# Patient Record
Sex: Female | Born: 1935
Health system: Southern US, Community
[De-identification: ages and names within clinical notes are randomized; demographics above are authoritative.]

## PROBLEM LIST (undated history)

## (undated) DIAGNOSIS — I10 Essential (primary) hypertension: Secondary | ICD-10-CM

## (undated) DIAGNOSIS — L570 Actinic keratosis: Secondary | ICD-10-CM

## (undated) DIAGNOSIS — E039 Hypothyroidism, unspecified: Secondary | ICD-10-CM

## (undated) DIAGNOSIS — L821 Other seborrheic keratosis: Secondary | ICD-10-CM

## (undated) DIAGNOSIS — E2839 Other primary ovarian failure: Secondary | ICD-10-CM

## (undated) DIAGNOSIS — I4729 Other ventricular tachycardia: Secondary | ICD-10-CM

## (undated) DIAGNOSIS — R0600 Dyspnea, unspecified: Secondary | ICD-10-CM

## (undated) DIAGNOSIS — M1990 Unspecified osteoarthritis, unspecified site: Secondary | ICD-10-CM

## (undated) DIAGNOSIS — N289 Disorder of kidney and ureter, unspecified: Secondary | ICD-10-CM

## (undated) DIAGNOSIS — R000 Tachycardia, unspecified: Secondary | ICD-10-CM

## (undated) DIAGNOSIS — H9190 Unspecified hearing loss, unspecified ear: Secondary | ICD-10-CM

## (undated) DIAGNOSIS — D7589 Other specified diseases of blood and blood-forming organs: Secondary | ICD-10-CM

## (undated) DIAGNOSIS — M19019 Primary osteoarthritis, unspecified shoulder: Secondary | ICD-10-CM

## (undated) DIAGNOSIS — G8929 Other chronic pain: Secondary | ICD-10-CM

## (undated) DIAGNOSIS — I272 Pulmonary hypertension, unspecified: Secondary | ICD-10-CM

## (undated) DIAGNOSIS — I2510 Atherosclerotic heart disease of native coronary artery without angina pectoris: Secondary | ICD-10-CM

## (undated) DIAGNOSIS — T148XXA Other injury of unspecified body region, initial encounter: Secondary | ICD-10-CM

## (undated) DIAGNOSIS — I472 Ventricular tachycardia: Secondary | ICD-10-CM

## (undated) DIAGNOSIS — E559 Vitamin D deficiency, unspecified: Secondary | ICD-10-CM

## (undated) DIAGNOSIS — E7800 Pure hypercholesterolemia, unspecified: Secondary | ICD-10-CM

## (undated) DIAGNOSIS — I351 Nonrheumatic aortic (valve) insufficiency: Secondary | ICD-10-CM

## (undated) DIAGNOSIS — I219 Acute myocardial infarction, unspecified: Secondary | ICD-10-CM

## (undated) DIAGNOSIS — I4949 Other premature depolarization: Secondary | ICD-10-CM

## (undated) DIAGNOSIS — I340 Nonrheumatic mitral (valve) insufficiency: Secondary | ICD-10-CM

## (undated) DIAGNOSIS — M549 Dorsalgia, unspecified: Secondary | ICD-10-CM

## (undated) DIAGNOSIS — I447 Left bundle-branch block, unspecified: Secondary | ICD-10-CM

## (undated) DIAGNOSIS — I5022 Chronic systolic (congestive) heart failure: Secondary | ICD-10-CM

## (undated) DIAGNOSIS — M109 Gout, unspecified: Secondary | ICD-10-CM

## (undated) HISTORY — DX: Essential (primary) hypertension: I10

## (undated) HISTORY — DX: Tachycardia, unspecified: R00.0

## (undated) HISTORY — DX: Unspecified osteoarthritis, unspecified site: M19.90

## (undated) HISTORY — DX: Other seborrheic keratosis: L82.1

## (undated) HISTORY — DX: Other primary ovarian failure: E28.39

## (undated) HISTORY — PX: CATARACT EXTRACTION, BILATERAL: SHX1313

## (undated) HISTORY — DX: Vitamin D deficiency, unspecified: E55.9

## (undated) HISTORY — DX: Gout, unspecified: M10.9

## (undated) HISTORY — DX: Other premature depolarization: I49.49

## (undated) HISTORY — DX: Pure hypercholesterolemia, unspecified: E78.00

## (undated) HISTORY — DX: Actinic keratosis: L57.0

## (undated) HISTORY — PX: APPENDECTOMY: SHX54

## (undated) HISTORY — DX: Disorder of kidney and ureter, unspecified: N28.9

## (undated) HISTORY — DX: Unspecified hearing loss, unspecified ear: H91.90

## (undated) HISTORY — DX: Primary osteoarthritis, unspecified shoulder: M19.019

## (undated) HISTORY — DX: Hypercalcemia: E83.52

## (undated) HISTORY — PX: ABDOMINAL HYSTERECTOMY: SHX81

## (undated) HISTORY — DX: Left bundle-branch block, unspecified: I44.7

## (undated) HISTORY — PX: COLONOSCOPY: SHX174

---

## 2015-01-24 DIAGNOSIS — M1712 Unilateral primary osteoarthritis, left knee: Secondary | ICD-10-CM | POA: Diagnosis not present

## 2015-01-24 DIAGNOSIS — M1711 Unilateral primary osteoarthritis, right knee: Secondary | ICD-10-CM | POA: Diagnosis not present

## 2015-01-24 DIAGNOSIS — M170 Bilateral primary osteoarthritis of knee: Secondary | ICD-10-CM | POA: Diagnosis not present

## 2015-04-16 DIAGNOSIS — Z6830 Body mass index (BMI) 30.0-30.9, adult: Secondary | ICD-10-CM | POA: Diagnosis not present

## 2015-04-16 DIAGNOSIS — E785 Hyperlipidemia, unspecified: Secondary | ICD-10-CM | POA: Diagnosis not present

## 2015-04-16 DIAGNOSIS — Z789 Other specified health status: Secondary | ICD-10-CM | POA: Diagnosis not present

## 2015-04-16 DIAGNOSIS — I10 Essential (primary) hypertension: Secondary | ICD-10-CM | POA: Diagnosis not present

## 2015-05-28 DIAGNOSIS — E7800 Pure hypercholesterolemia, unspecified: Secondary | ICD-10-CM | POA: Diagnosis not present

## 2015-05-28 DIAGNOSIS — I10 Essential (primary) hypertension: Secondary | ICD-10-CM | POA: Diagnosis not present

## 2015-05-28 DIAGNOSIS — M159 Polyosteoarthritis, unspecified: Secondary | ICD-10-CM | POA: Diagnosis not present

## 2015-07-02 DIAGNOSIS — M159 Polyosteoarthritis, unspecified: Secondary | ICD-10-CM | POA: Diagnosis not present

## 2015-07-02 DIAGNOSIS — E7800 Pure hypercholesterolemia, unspecified: Secondary | ICD-10-CM | POA: Diagnosis not present

## 2015-07-02 DIAGNOSIS — I10 Essential (primary) hypertension: Secondary | ICD-10-CM | POA: Diagnosis not present

## 2015-07-22 DIAGNOSIS — M1711 Unilateral primary osteoarthritis, right knee: Secondary | ICD-10-CM | POA: Diagnosis not present

## 2015-07-22 DIAGNOSIS — M1712 Unilateral primary osteoarthritis, left knee: Secondary | ICD-10-CM | POA: Diagnosis not present

## 2015-07-22 DIAGNOSIS — M170 Bilateral primary osteoarthritis of knee: Secondary | ICD-10-CM | POA: Diagnosis not present

## 2015-07-26 DIAGNOSIS — E7800 Pure hypercholesterolemia, unspecified: Secondary | ICD-10-CM | POA: Diagnosis not present

## 2015-07-26 DIAGNOSIS — I10 Essential (primary) hypertension: Secondary | ICD-10-CM | POA: Diagnosis not present

## 2015-07-26 DIAGNOSIS — M159 Polyosteoarthritis, unspecified: Secondary | ICD-10-CM | POA: Diagnosis not present

## 2015-08-27 DIAGNOSIS — E7800 Pure hypercholesterolemia, unspecified: Secondary | ICD-10-CM | POA: Diagnosis not present

## 2015-08-27 DIAGNOSIS — M159 Polyosteoarthritis, unspecified: Secondary | ICD-10-CM | POA: Diagnosis not present

## 2015-08-27 DIAGNOSIS — I10 Essential (primary) hypertension: Secondary | ICD-10-CM | POA: Diagnosis not present

## 2015-09-13 DIAGNOSIS — I10 Essential (primary) hypertension: Secondary | ICD-10-CM | POA: Diagnosis not present

## 2015-09-13 DIAGNOSIS — M159 Polyosteoarthritis, unspecified: Secondary | ICD-10-CM | POA: Diagnosis not present

## 2015-09-13 DIAGNOSIS — E7800 Pure hypercholesterolemia, unspecified: Secondary | ICD-10-CM | POA: Diagnosis not present

## 2015-10-23 DIAGNOSIS — Z6830 Body mass index (BMI) 30.0-30.9, adult: Secondary | ICD-10-CM | POA: Diagnosis not present

## 2015-10-23 DIAGNOSIS — Z299 Encounter for prophylactic measures, unspecified: Secondary | ICD-10-CM | POA: Diagnosis not present

## 2015-10-23 DIAGNOSIS — E785 Hyperlipidemia, unspecified: Secondary | ICD-10-CM | POA: Diagnosis not present

## 2015-10-23 DIAGNOSIS — Z0000 Encounter for general adult medical examination without abnormal findings: Secondary | ICD-10-CM | POA: Diagnosis not present

## 2015-10-23 DIAGNOSIS — Z1211 Encounter for screening for malignant neoplasm of colon: Secondary | ICD-10-CM | POA: Diagnosis not present

## 2015-10-23 DIAGNOSIS — Z1389 Encounter for screening for other disorder: Secondary | ICD-10-CM | POA: Diagnosis not present

## 2015-10-23 DIAGNOSIS — Z79899 Other long term (current) drug therapy: Secondary | ICD-10-CM | POA: Diagnosis not present

## 2015-10-23 DIAGNOSIS — R5383 Other fatigue: Secondary | ICD-10-CM | POA: Diagnosis not present

## 2015-10-23 DIAGNOSIS — Z7189 Other specified counseling: Secondary | ICD-10-CM | POA: Diagnosis not present

## 2015-10-23 DIAGNOSIS — E559 Vitamin D deficiency, unspecified: Secondary | ICD-10-CM | POA: Diagnosis not present

## 2015-10-24 DIAGNOSIS — M1712 Unilateral primary osteoarthritis, left knee: Secondary | ICD-10-CM | POA: Diagnosis not present

## 2015-10-24 DIAGNOSIS — M25561 Pain in right knee: Secondary | ICD-10-CM | POA: Diagnosis not present

## 2015-10-24 DIAGNOSIS — M1711 Unilateral primary osteoarthritis, right knee: Secondary | ICD-10-CM | POA: Diagnosis not present

## 2015-11-04 DIAGNOSIS — I10 Essential (primary) hypertension: Secondary | ICD-10-CM | POA: Diagnosis not present

## 2015-11-04 DIAGNOSIS — M159 Polyosteoarthritis, unspecified: Secondary | ICD-10-CM | POA: Diagnosis not present

## 2015-11-04 DIAGNOSIS — E7800 Pure hypercholesterolemia, unspecified: Secondary | ICD-10-CM | POA: Diagnosis not present

## 2015-12-02 DIAGNOSIS — M159 Polyosteoarthritis, unspecified: Secondary | ICD-10-CM | POA: Diagnosis not present

## 2015-12-02 DIAGNOSIS — I10 Essential (primary) hypertension: Secondary | ICD-10-CM | POA: Diagnosis not present

## 2015-12-02 DIAGNOSIS — E7800 Pure hypercholesterolemia, unspecified: Secondary | ICD-10-CM | POA: Diagnosis not present

## 2015-12-17 DIAGNOSIS — E2839 Other primary ovarian failure: Secondary | ICD-10-CM | POA: Diagnosis not present

## 2015-12-24 DIAGNOSIS — E7800 Pure hypercholesterolemia, unspecified: Secondary | ICD-10-CM | POA: Diagnosis not present

## 2015-12-24 DIAGNOSIS — I10 Essential (primary) hypertension: Secondary | ICD-10-CM | POA: Diagnosis not present

## 2015-12-24 DIAGNOSIS — M159 Polyosteoarthritis, unspecified: Secondary | ICD-10-CM | POA: Diagnosis not present

## 2016-01-11 DIAGNOSIS — Z23 Encounter for immunization: Secondary | ICD-10-CM | POA: Diagnosis not present

## 2016-01-21 DIAGNOSIS — E7800 Pure hypercholesterolemia, unspecified: Secondary | ICD-10-CM | POA: Diagnosis not present

## 2016-01-21 DIAGNOSIS — I10 Essential (primary) hypertension: Secondary | ICD-10-CM | POA: Diagnosis not present

## 2016-01-21 DIAGNOSIS — M159 Polyosteoarthritis, unspecified: Secondary | ICD-10-CM | POA: Diagnosis not present

## 2016-02-26 DIAGNOSIS — I10 Essential (primary) hypertension: Secondary | ICD-10-CM | POA: Diagnosis not present

## 2016-02-26 DIAGNOSIS — E7800 Pure hypercholesterolemia, unspecified: Secondary | ICD-10-CM | POA: Diagnosis not present

## 2016-02-26 DIAGNOSIS — M159 Polyosteoarthritis, unspecified: Secondary | ICD-10-CM | POA: Diagnosis not present

## 2016-03-23 DIAGNOSIS — E7800 Pure hypercholesterolemia, unspecified: Secondary | ICD-10-CM | POA: Diagnosis not present

## 2016-03-23 DIAGNOSIS — I10 Essential (primary) hypertension: Secondary | ICD-10-CM | POA: Diagnosis not present

## 2016-03-23 DIAGNOSIS — M159 Polyosteoarthritis, unspecified: Secondary | ICD-10-CM | POA: Diagnosis not present

## 2016-04-09 DIAGNOSIS — M545 Low back pain: Secondary | ICD-10-CM | POA: Diagnosis not present

## 2016-04-09 DIAGNOSIS — Z299 Encounter for prophylactic measures, unspecified: Secondary | ICD-10-CM | POA: Diagnosis not present

## 2016-04-09 DIAGNOSIS — Z789 Other specified health status: Secondary | ICD-10-CM | POA: Diagnosis not present

## 2016-04-09 DIAGNOSIS — Z713 Dietary counseling and surveillance: Secondary | ICD-10-CM | POA: Diagnosis not present

## 2016-04-09 DIAGNOSIS — Z6831 Body mass index (BMI) 31.0-31.9, adult: Secondary | ICD-10-CM | POA: Diagnosis not present

## 2016-04-09 DIAGNOSIS — I10 Essential (primary) hypertension: Secondary | ICD-10-CM | POA: Diagnosis not present

## 2016-04-20 DIAGNOSIS — E7800 Pure hypercholesterolemia, unspecified: Secondary | ICD-10-CM | POA: Diagnosis not present

## 2016-04-20 DIAGNOSIS — M159 Polyosteoarthritis, unspecified: Secondary | ICD-10-CM | POA: Diagnosis not present

## 2016-04-20 DIAGNOSIS — I10 Essential (primary) hypertension: Secondary | ICD-10-CM | POA: Diagnosis not present

## 2016-05-07 DIAGNOSIS — I10 Essential (primary) hypertension: Secondary | ICD-10-CM | POA: Diagnosis not present

## 2016-05-07 DIAGNOSIS — E7800 Pure hypercholesterolemia, unspecified: Secondary | ICD-10-CM | POA: Diagnosis not present

## 2016-05-07 DIAGNOSIS — M159 Polyosteoarthritis, unspecified: Secondary | ICD-10-CM | POA: Diagnosis not present

## 2016-05-19 DIAGNOSIS — H10503 Unspecified blepharoconjunctivitis, bilateral: Secondary | ICD-10-CM | POA: Diagnosis not present

## 2016-05-22 DIAGNOSIS — R1013 Epigastric pain: Secondary | ICD-10-CM | POA: Diagnosis not present

## 2016-05-22 DIAGNOSIS — R1032 Left lower quadrant pain: Secondary | ICD-10-CM | POA: Diagnosis not present

## 2016-05-22 DIAGNOSIS — R1110 Vomiting, unspecified: Secondary | ICD-10-CM | POA: Diagnosis not present

## 2016-05-22 DIAGNOSIS — K529 Noninfective gastroenteritis and colitis, unspecified: Secondary | ICD-10-CM | POA: Diagnosis not present

## 2016-05-22 DIAGNOSIS — M1990 Unspecified osteoarthritis, unspecified site: Secondary | ICD-10-CM | POA: Diagnosis not present

## 2016-05-22 DIAGNOSIS — R109 Unspecified abdominal pain: Secondary | ICD-10-CM | POA: Diagnosis not present

## 2016-05-22 DIAGNOSIS — I10 Essential (primary) hypertension: Secondary | ICD-10-CM | POA: Diagnosis not present

## 2016-06-02 DIAGNOSIS — Z6830 Body mass index (BMI) 30.0-30.9, adult: Secondary | ICD-10-CM | POA: Diagnosis not present

## 2016-06-02 DIAGNOSIS — K529 Noninfective gastroenteritis and colitis, unspecified: Secondary | ICD-10-CM | POA: Diagnosis not present

## 2016-06-02 DIAGNOSIS — Z299 Encounter for prophylactic measures, unspecified: Secondary | ICD-10-CM | POA: Diagnosis not present

## 2016-06-02 DIAGNOSIS — E7800 Pure hypercholesterolemia, unspecified: Secondary | ICD-10-CM | POA: Diagnosis not present

## 2016-06-02 DIAGNOSIS — Z713 Dietary counseling and surveillance: Secondary | ICD-10-CM | POA: Diagnosis not present

## 2016-06-02 DIAGNOSIS — I6529 Occlusion and stenosis of unspecified carotid artery: Secondary | ICD-10-CM | POA: Diagnosis not present

## 2016-06-02 DIAGNOSIS — I10 Essential (primary) hypertension: Secondary | ICD-10-CM | POA: Diagnosis not present

## 2016-06-02 DIAGNOSIS — E785 Hyperlipidemia, unspecified: Secondary | ICD-10-CM | POA: Diagnosis not present

## 2016-06-02 DIAGNOSIS — K219 Gastro-esophageal reflux disease without esophagitis: Secondary | ICD-10-CM | POA: Diagnosis not present

## 2016-06-08 DIAGNOSIS — I10 Essential (primary) hypertension: Secondary | ICD-10-CM | POA: Diagnosis not present

## 2016-06-08 DIAGNOSIS — Z6830 Body mass index (BMI) 30.0-30.9, adult: Secondary | ICD-10-CM | POA: Diagnosis not present

## 2016-06-08 DIAGNOSIS — Z713 Dietary counseling and surveillance: Secondary | ICD-10-CM | POA: Diagnosis not present

## 2016-06-08 DIAGNOSIS — Z299 Encounter for prophylactic measures, unspecified: Secondary | ICD-10-CM | POA: Diagnosis not present

## 2016-06-08 DIAGNOSIS — K5900 Constipation, unspecified: Secondary | ICD-10-CM | POA: Diagnosis not present

## 2016-07-02 DIAGNOSIS — H524 Presbyopia: Secondary | ICD-10-CM | POA: Diagnosis not present

## 2016-07-02 DIAGNOSIS — H43813 Vitreous degeneration, bilateral: Secondary | ICD-10-CM | POA: Diagnosis not present

## 2016-07-02 DIAGNOSIS — H52221 Regular astigmatism, right eye: Secondary | ICD-10-CM | POA: Diagnosis not present

## 2016-07-02 DIAGNOSIS — H5212 Myopia, left eye: Secondary | ICD-10-CM | POA: Diagnosis not present

## 2016-07-28 DIAGNOSIS — I10 Essential (primary) hypertension: Secondary | ICD-10-CM | POA: Diagnosis not present

## 2016-07-28 DIAGNOSIS — E7800 Pure hypercholesterolemia, unspecified: Secondary | ICD-10-CM | POA: Diagnosis not present

## 2016-07-28 DIAGNOSIS — Z6830 Body mass index (BMI) 30.0-30.9, adult: Secondary | ICD-10-CM | POA: Diagnosis not present

## 2016-07-28 DIAGNOSIS — M545 Low back pain: Secondary | ICD-10-CM | POA: Diagnosis not present

## 2016-07-28 DIAGNOSIS — Z299 Encounter for prophylactic measures, unspecified: Secondary | ICD-10-CM | POA: Diagnosis not present

## 2016-07-29 DIAGNOSIS — M159 Polyosteoarthritis, unspecified: Secondary | ICD-10-CM | POA: Diagnosis not present

## 2016-07-29 DIAGNOSIS — E7800 Pure hypercholesterolemia, unspecified: Secondary | ICD-10-CM | POA: Diagnosis not present

## 2016-07-29 DIAGNOSIS — I10 Essential (primary) hypertension: Secondary | ICD-10-CM | POA: Diagnosis not present

## 2016-09-16 DIAGNOSIS — M159 Polyosteoarthritis, unspecified: Secondary | ICD-10-CM | POA: Diagnosis not present

## 2016-09-16 DIAGNOSIS — I10 Essential (primary) hypertension: Secondary | ICD-10-CM | POA: Diagnosis not present

## 2016-09-16 DIAGNOSIS — E7800 Pure hypercholesterolemia, unspecified: Secondary | ICD-10-CM | POA: Diagnosis not present

## 2016-10-12 DIAGNOSIS — M545 Low back pain: Secondary | ICD-10-CM | POA: Diagnosis not present

## 2016-10-12 DIAGNOSIS — M19019 Primary osteoarthritis, unspecified shoulder: Secondary | ICD-10-CM | POA: Diagnosis not present

## 2016-10-12 DIAGNOSIS — I447 Left bundle-branch block, unspecified: Secondary | ICD-10-CM | POA: Diagnosis not present

## 2016-10-12 DIAGNOSIS — Z6829 Body mass index (BMI) 29.0-29.9, adult: Secondary | ICD-10-CM | POA: Diagnosis not present

## 2016-10-12 DIAGNOSIS — Z299 Encounter for prophylactic measures, unspecified: Secondary | ICD-10-CM | POA: Diagnosis not present

## 2016-10-12 DIAGNOSIS — Z713 Dietary counseling and surveillance: Secondary | ICD-10-CM | POA: Diagnosis not present

## 2016-10-13 DIAGNOSIS — Z23 Encounter for immunization: Secondary | ICD-10-CM | POA: Diagnosis not present

## 2016-10-23 DIAGNOSIS — Z1339 Encounter for screening examination for other mental health and behavioral disorders: Secondary | ICD-10-CM | POA: Diagnosis not present

## 2016-10-23 DIAGNOSIS — Z79899 Other long term (current) drug therapy: Secondary | ICD-10-CM | POA: Diagnosis not present

## 2016-10-23 DIAGNOSIS — E559 Vitamin D deficiency, unspecified: Secondary | ICD-10-CM | POA: Diagnosis not present

## 2016-10-23 DIAGNOSIS — Z299 Encounter for prophylactic measures, unspecified: Secondary | ICD-10-CM | POA: Diagnosis not present

## 2016-10-23 DIAGNOSIS — Z6829 Body mass index (BMI) 29.0-29.9, adult: Secondary | ICD-10-CM | POA: Diagnosis not present

## 2016-10-23 DIAGNOSIS — M545 Low back pain: Secondary | ICD-10-CM | POA: Diagnosis not present

## 2016-10-23 DIAGNOSIS — Z1211 Encounter for screening for malignant neoplasm of colon: Secondary | ICD-10-CM | POA: Diagnosis not present

## 2016-10-23 DIAGNOSIS — R5383 Other fatigue: Secondary | ICD-10-CM | POA: Diagnosis not present

## 2016-10-23 DIAGNOSIS — Z7189 Other specified counseling: Secondary | ICD-10-CM | POA: Diagnosis not present

## 2016-10-23 DIAGNOSIS — E7800 Pure hypercholesterolemia, unspecified: Secondary | ICD-10-CM | POA: Diagnosis not present

## 2016-10-23 DIAGNOSIS — Z1331 Encounter for screening for depression: Secondary | ICD-10-CM | POA: Diagnosis not present

## 2016-10-23 DIAGNOSIS — Z0000 Encounter for general adult medical examination without abnormal findings: Secondary | ICD-10-CM | POA: Diagnosis not present

## 2016-12-10 DIAGNOSIS — I10 Essential (primary) hypertension: Secondary | ICD-10-CM | POA: Diagnosis not present

## 2016-12-10 DIAGNOSIS — M159 Polyosteoarthritis, unspecified: Secondary | ICD-10-CM | POA: Diagnosis not present

## 2016-12-10 DIAGNOSIS — E7800 Pure hypercholesterolemia, unspecified: Secondary | ICD-10-CM | POA: Diagnosis not present

## 2017-02-10 DIAGNOSIS — Z789 Other specified health status: Secondary | ICD-10-CM | POA: Diagnosis not present

## 2017-02-10 DIAGNOSIS — I10 Essential (primary) hypertension: Secondary | ICD-10-CM | POA: Diagnosis not present

## 2017-02-10 DIAGNOSIS — H6120 Impacted cerumen, unspecified ear: Secondary | ICD-10-CM | POA: Diagnosis not present

## 2017-02-10 DIAGNOSIS — Z6829 Body mass index (BMI) 29.0-29.9, adult: Secondary | ICD-10-CM | POA: Diagnosis not present

## 2017-02-10 DIAGNOSIS — Z299 Encounter for prophylactic measures, unspecified: Secondary | ICD-10-CM | POA: Diagnosis not present

## 2017-02-17 DIAGNOSIS — I10 Essential (primary) hypertension: Secondary | ICD-10-CM | POA: Diagnosis not present

## 2017-02-17 DIAGNOSIS — M159 Polyosteoarthritis, unspecified: Secondary | ICD-10-CM | POA: Diagnosis not present

## 2017-02-17 DIAGNOSIS — E7800 Pure hypercholesterolemia, unspecified: Secondary | ICD-10-CM | POA: Diagnosis not present

## 2017-02-26 DIAGNOSIS — I447 Left bundle-branch block, unspecified: Secondary | ICD-10-CM | POA: Diagnosis not present

## 2017-02-26 DIAGNOSIS — Z6828 Body mass index (BMI) 28.0-28.9, adult: Secondary | ICD-10-CM | POA: Diagnosis not present

## 2017-02-26 DIAGNOSIS — M545 Low back pain: Secondary | ICD-10-CM | POA: Diagnosis not present

## 2017-02-26 DIAGNOSIS — I10 Essential (primary) hypertension: Secondary | ICD-10-CM | POA: Diagnosis not present

## 2017-02-26 DIAGNOSIS — Z299 Encounter for prophylactic measures, unspecified: Secondary | ICD-10-CM | POA: Diagnosis not present

## 2017-02-26 DIAGNOSIS — E785 Hyperlipidemia, unspecified: Secondary | ICD-10-CM | POA: Diagnosis not present

## 2017-03-12 DIAGNOSIS — E7800 Pure hypercholesterolemia, unspecified: Secondary | ICD-10-CM | POA: Diagnosis not present

## 2017-03-12 DIAGNOSIS — I10 Essential (primary) hypertension: Secondary | ICD-10-CM | POA: Diagnosis not present

## 2017-03-12 DIAGNOSIS — M159 Polyosteoarthritis, unspecified: Secondary | ICD-10-CM | POA: Diagnosis not present

## 2017-05-26 DIAGNOSIS — I10 Essential (primary) hypertension: Secondary | ICD-10-CM | POA: Diagnosis not present

## 2017-05-26 DIAGNOSIS — Z6828 Body mass index (BMI) 28.0-28.9, adult: Secondary | ICD-10-CM | POA: Diagnosis not present

## 2017-05-26 DIAGNOSIS — M19019 Primary osteoarthritis, unspecified shoulder: Secondary | ICD-10-CM | POA: Diagnosis not present

## 2017-05-26 DIAGNOSIS — Z713 Dietary counseling and surveillance: Secondary | ICD-10-CM | POA: Diagnosis not present

## 2017-05-26 DIAGNOSIS — Z299 Encounter for prophylactic measures, unspecified: Secondary | ICD-10-CM | POA: Diagnosis not present

## 2017-05-28 DIAGNOSIS — M159 Polyosteoarthritis, unspecified: Secondary | ICD-10-CM | POA: Diagnosis not present

## 2017-05-28 DIAGNOSIS — I10 Essential (primary) hypertension: Secondary | ICD-10-CM | POA: Diagnosis not present

## 2017-05-28 DIAGNOSIS — E7800 Pure hypercholesterolemia, unspecified: Secondary | ICD-10-CM | POA: Diagnosis not present

## 2017-06-07 DIAGNOSIS — E7800 Pure hypercholesterolemia, unspecified: Secondary | ICD-10-CM | POA: Diagnosis not present

## 2017-06-07 DIAGNOSIS — I10 Essential (primary) hypertension: Secondary | ICD-10-CM | POA: Diagnosis not present

## 2017-06-07 DIAGNOSIS — M159 Polyosteoarthritis, unspecified: Secondary | ICD-10-CM | POA: Diagnosis not present

## 2017-06-23 DIAGNOSIS — Z299 Encounter for prophylactic measures, unspecified: Secondary | ICD-10-CM | POA: Diagnosis not present

## 2017-06-23 DIAGNOSIS — M545 Low back pain: Secondary | ICD-10-CM | POA: Diagnosis not present

## 2017-06-23 DIAGNOSIS — I10 Essential (primary) hypertension: Secondary | ICD-10-CM | POA: Diagnosis not present

## 2017-06-23 DIAGNOSIS — E7800 Pure hypercholesterolemia, unspecified: Secondary | ICD-10-CM | POA: Diagnosis not present

## 2017-06-23 DIAGNOSIS — Z6829 Body mass index (BMI) 29.0-29.9, adult: Secondary | ICD-10-CM | POA: Diagnosis not present

## 2017-07-05 DIAGNOSIS — I219 Acute myocardial infarction, unspecified: Secondary | ICD-10-CM

## 2017-07-05 HISTORY — DX: Acute myocardial infarction, unspecified: I21.9

## 2017-07-09 DIAGNOSIS — I10 Essential (primary) hypertension: Secondary | ICD-10-CM | POA: Diagnosis not present

## 2017-07-09 DIAGNOSIS — E7800 Pure hypercholesterolemia, unspecified: Secondary | ICD-10-CM | POA: Diagnosis not present

## 2017-07-09 DIAGNOSIS — Z299 Encounter for prophylactic measures, unspecified: Secondary | ICD-10-CM | POA: Diagnosis not present

## 2017-07-09 DIAGNOSIS — Z6829 Body mass index (BMI) 29.0-29.9, adult: Secondary | ICD-10-CM | POA: Diagnosis not present

## 2017-07-09 DIAGNOSIS — R000 Tachycardia, unspecified: Secondary | ICD-10-CM | POA: Diagnosis not present

## 2017-07-21 DIAGNOSIS — H9190 Unspecified hearing loss, unspecified ear: Secondary | ICD-10-CM | POA: Diagnosis not present

## 2017-07-21 DIAGNOSIS — R61 Generalized hyperhidrosis: Secondary | ICD-10-CM | POA: Diagnosis not present

## 2017-07-21 DIAGNOSIS — E7800 Pure hypercholesterolemia, unspecified: Secondary | ICD-10-CM | POA: Diagnosis not present

## 2017-07-21 DIAGNOSIS — E039 Hypothyroidism, unspecified: Secondary | ICD-10-CM | POA: Diagnosis not present

## 2017-07-21 DIAGNOSIS — N289 Disorder of kidney and ureter, unspecified: Secondary | ICD-10-CM | POA: Diagnosis not present

## 2017-07-21 DIAGNOSIS — Z299 Encounter for prophylactic measures, unspecified: Secondary | ICD-10-CM | POA: Diagnosis not present

## 2017-07-21 DIAGNOSIS — R42 Dizziness and giddiness: Secondary | ICD-10-CM | POA: Diagnosis not present

## 2017-07-21 DIAGNOSIS — Z882 Allergy status to sulfonamides status: Secondary | ICD-10-CM | POA: Diagnosis not present

## 2017-07-21 DIAGNOSIS — M1990 Unspecified osteoarthritis, unspecified site: Secondary | ICD-10-CM | POA: Diagnosis not present

## 2017-07-21 DIAGNOSIS — Z886 Allergy status to analgesic agent status: Secondary | ICD-10-CM | POA: Diagnosis not present

## 2017-07-21 DIAGNOSIS — Z888 Allergy status to other drugs, medicaments and biological substances status: Secondary | ICD-10-CM | POA: Diagnosis not present

## 2017-07-21 DIAGNOSIS — I10 Essential (primary) hypertension: Secondary | ICD-10-CM | POA: Diagnosis not present

## 2017-07-21 DIAGNOSIS — R002 Palpitations: Secondary | ICD-10-CM | POA: Diagnosis not present

## 2017-07-21 DIAGNOSIS — R55 Syncope and collapse: Secondary | ICD-10-CM | POA: Diagnosis not present

## 2017-07-21 DIAGNOSIS — Z881 Allergy status to other antibiotic agents status: Secondary | ICD-10-CM | POA: Diagnosis not present

## 2017-07-21 DIAGNOSIS — Z6828 Body mass index (BMI) 28.0-28.9, adult: Secondary | ICD-10-CM | POA: Diagnosis not present

## 2017-07-21 DIAGNOSIS — R000 Tachycardia, unspecified: Secondary | ICD-10-CM | POA: Diagnosis not present

## 2017-07-21 DIAGNOSIS — Z79899 Other long term (current) drug therapy: Secondary | ICD-10-CM | POA: Diagnosis not present

## 2017-07-21 DIAGNOSIS — I447 Left bundle-branch block, unspecified: Secondary | ICD-10-CM | POA: Diagnosis not present

## 2017-07-22 DIAGNOSIS — E7800 Pure hypercholesterolemia, unspecified: Secondary | ICD-10-CM | POA: Diagnosis not present

## 2017-07-22 DIAGNOSIS — I10 Essential (primary) hypertension: Secondary | ICD-10-CM | POA: Diagnosis not present

## 2017-07-22 DIAGNOSIS — I447 Left bundle-branch block, unspecified: Secondary | ICD-10-CM | POA: Diagnosis not present

## 2017-07-22 DIAGNOSIS — R55 Syncope and collapse: Secondary | ICD-10-CM | POA: Diagnosis not present

## 2017-07-22 DIAGNOSIS — E039 Hypothyroidism, unspecified: Secondary | ICD-10-CM | POA: Diagnosis not present

## 2017-07-22 DIAGNOSIS — Z79899 Other long term (current) drug therapy: Secondary | ICD-10-CM | POA: Diagnosis not present

## 2017-07-26 DIAGNOSIS — I10 Essential (primary) hypertension: Secondary | ICD-10-CM | POA: Diagnosis not present

## 2017-07-26 DIAGNOSIS — I447 Left bundle-branch block, unspecified: Secondary | ICD-10-CM | POA: Diagnosis not present

## 2017-07-26 DIAGNOSIS — E785 Hyperlipidemia, unspecified: Secondary | ICD-10-CM | POA: Diagnosis not present

## 2017-07-26 DIAGNOSIS — Z6828 Body mass index (BMI) 28.0-28.9, adult: Secondary | ICD-10-CM | POA: Diagnosis not present

## 2017-07-26 DIAGNOSIS — M1710 Unilateral primary osteoarthritis, unspecified knee: Secondary | ICD-10-CM | POA: Diagnosis not present

## 2017-07-26 DIAGNOSIS — Z299 Encounter for prophylactic measures, unspecified: Secondary | ICD-10-CM | POA: Diagnosis not present

## 2017-07-26 DIAGNOSIS — K210 Gastro-esophageal reflux disease with esophagitis: Secondary | ICD-10-CM | POA: Diagnosis not present

## 2017-08-02 DIAGNOSIS — R9431 Abnormal electrocardiogram [ECG] [EKG]: Secondary | ICD-10-CM | POA: Diagnosis not present

## 2017-08-06 DIAGNOSIS — Z299 Encounter for prophylactic measures, unspecified: Secondary | ICD-10-CM | POA: Diagnosis not present

## 2017-08-06 DIAGNOSIS — Z6828 Body mass index (BMI) 28.0-28.9, adult: Secondary | ICD-10-CM | POA: Diagnosis not present

## 2017-08-06 DIAGNOSIS — R600 Localized edema: Secondary | ICD-10-CM | POA: Diagnosis not present

## 2017-08-06 DIAGNOSIS — I10 Essential (primary) hypertension: Secondary | ICD-10-CM | POA: Diagnosis not present

## 2017-08-06 DIAGNOSIS — K210 Gastro-esophageal reflux disease with esophagitis: Secondary | ICD-10-CM | POA: Diagnosis not present

## 2017-08-09 ENCOUNTER — Encounter: Payer: Self-pay | Admitting: *Deleted

## 2017-08-10 ENCOUNTER — Ambulatory Visit (INDEPENDENT_AMBULATORY_CARE_PROVIDER_SITE_OTHER): Payer: Medicare Other | Admitting: Cardiovascular Disease

## 2017-08-10 ENCOUNTER — Telehealth: Payer: Self-pay | Admitting: Cardiovascular Disease

## 2017-08-10 ENCOUNTER — Encounter: Payer: Self-pay | Admitting: Cardiovascular Disease

## 2017-08-10 ENCOUNTER — Encounter: Payer: Self-pay | Admitting: *Deleted

## 2017-08-10 VITALS — BP 98/62 | HR 69 | Ht 63.0 in | Wt 158.0 lb

## 2017-08-10 DIAGNOSIS — I428 Other cardiomyopathies: Secondary | ICD-10-CM | POA: Diagnosis not present

## 2017-08-10 DIAGNOSIS — R000 Tachycardia, unspecified: Secondary | ICD-10-CM

## 2017-08-10 DIAGNOSIS — I5022 Chronic systolic (congestive) heart failure: Secondary | ICD-10-CM | POA: Diagnosis not present

## 2017-08-10 DIAGNOSIS — I447 Left bundle-branch block, unspecified: Secondary | ICD-10-CM | POA: Diagnosis not present

## 2017-08-10 DIAGNOSIS — E785 Hyperlipidemia, unspecified: Secondary | ICD-10-CM | POA: Diagnosis not present

## 2017-08-10 DIAGNOSIS — I519 Heart disease, unspecified: Secondary | ICD-10-CM

## 2017-08-10 DIAGNOSIS — I10 Essential (primary) hypertension: Secondary | ICD-10-CM

## 2017-08-10 DIAGNOSIS — R0601 Orthopnea: Secondary | ICD-10-CM

## 2017-08-10 MED ORDER — SACUBITRIL-VALSARTAN 24-26 MG PO TABS: 1.0000 | ORAL_TABLET | Freq: Two times a day (BID) | ORAL | 6 refills | Status: DC

## 2017-08-10 MED ORDER — FUROSEMIDE 40 MG PO TABS: 40.0000 mg | ORAL_TABLET | Freq: Every day | ORAL | 6 refills | Status: DC

## 2017-08-10 MED ORDER — POTASSIUM CHLORIDE CRYS ER 10 MEQ PO TBCR: 20.0000 meq | EXTENDED_RELEASE_TABLET | Freq: Every day | ORAL | 6 refills | Status: DC

## 2017-08-10 MED ORDER — SACUBITRIL-VALSARTAN 24-26 MG PO TABS: 1.0000 | ORAL_TABLET | Freq: Two times a day (BID) | ORAL | 0 refills | Status: DC

## 2017-08-10 NOTE — Telephone Encounter (Signed)
Pre-cert Verification for the following procedure   lexiscan scheduled for 08-13-2017 at [facility]

## 2017-08-10 NOTE — Progress Notes (Signed)
CARDIOLOGY CONSULT NOTE  Patient ID: Danielle Bell MRN: [id number] DOB/AGE: 03/14/1927 84 y.o.  Admit date: (Not on file) Primary Physician: Kirstie Peri, MD Referring Physician: Kirstie Peri, MD  Reason for Consultation: Cardiomyopathy  HPI: Danielle Bell is a 84 y.o. female who is being seen today for the evaluation of cardiomyopathy at the request of Kirstie Peri, MD.   Past medical history includes hypertension and hypercholesterolemia.  She has a history of tachycardia and a left bundle branch block as per PCP notes.  I personally reviewed an ECG performed on 07/21/2017 which demonstrated sinus rhythm with left bundle branch block and PACs.  Read another ECG performed earlier in July which demonstrated sinus rhythm with a left bundle branch block.  I reviewed an echocardiogram report dated 08/02/2017 which showed severely reduced left ventricular systolic function, LVEF 20 to 65%, grade 2 diastolic dysfunction, moderate mitral and mild to moderate aortic, pulmonic, and tricuspid regurgitation with moderate pulmonary hypertension, PASP 45-50 mill meters mercury.  She is currently on metoprolol succinate, losartan, and Lasix 20 mg daily.  She is here with her daughter.  She told me on 1 days she felt sick to her stomach and nauseous and had a "funny feeling" inside her chest.  She went and saw her PCP.  He did an ECG and then hospitalized her overnight at [facility].  I will have to request a copy of these records.  She denies chest pain.  She has mild and occasional shortness of breath when she overdoes it and with activity such as getting dressed.  For the past 2 or 3 nights she has been sleeping in recliner.  She denies paroxysmal nocturnal dyspnea.  Her ankles and feet feel tight.  She does not have much urine output as per patient and her daughter on Lasix 20 mg daily.  She was started on Lasix last week and she was started on Toprol-XL and losartan 50 mg on  07/22/2017.  Prior to that she had been on a higher dose of losartan and had been on amlodipine.  She denies a personal history of MI.  ECG performed in the office today which I ordered and personally interpreted demonstrates normal sinus rhythm with nonspecific IVCD, QRS 114 ms, with PVCs, and nonspecific ST segment and T wave abnormalities particularly in high lateral leads.  There is late R wave transition.   Allergies  Allergen Reactions  . Asa [Aspirin]     Epigastric discomfort; hives   . Ciprofloxacin     Headache   . Hctz [Hydrochlorothiazide]     Possible high calcium   . Lescol Xl [Fluvastatin]     Reflux   . Sulfa Antibiotics     abd upset     Current Outpatient Medications  Medication Sig Dispense Refill  . atorvastatin (LIPITOR) 20 MG tablet Take 20 mg by mouth daily.    . cholecalciferol (VITAMIN D) 1000 units tablet Take 2,000 Units by mouth daily.    . fluticasone (FLONASE) 50 MCG/ACT nasal spray Place 1 spray into both nostrils daily.    . furosemide (LASIX) 20 MG tablet Take 20 mg by mouth daily.    Marland Kitchen levothyroxine (SYNTHROID, LEVOTHROID) 88 MCG tablet Take 88 mcg by mouth daily before breakfast.    . losartan (COZAAR) 50 MG tablet Take 50 mg by mouth daily.    . metoprolol succinate (TOPROL-XL) 25 MG 24 hr tablet Take 25 mg by mouth 2 (two) times daily.    Marland Kitchen  pantoprazole (PROTONIX) 40 MG tablet Take 40 mg by mouth daily.    . potassium chloride (K-DUR,KLOR-CON) 10 MEQ tablet Take 10 mEq by mouth daily.    . traMADol (ULTRAM) 50 MG tablet Take by mouth every 6 (six) hours as needed.     No current facility-administered medications for this visit.     Past Medical History:  Diagnosis Date  . Actinic keratosis   . Degenerative joint disease   . Gout   . Hearing loss   . Hearing loss   . High cholesterol   . Hypercalcemia   . Hypertension   . Keratosis, seborrheic   . LBBB (left bundle branch block)   . Premature beats   . Primary ovarian failure   .  Renal insufficiency syndrome   . Shoulder arthritis   . Tachycardia   . Vitamin D deficiency     Past Surgical History:  Procedure Laterality Date  . APPENDECTOMY    . COLONOSCOPY    . macrocytosis      Social History   Socioeconomic History  . Marital status: Married    Spouse name: Not on file  . Number of children: Not on file  . Years of education: Not on file  . Highest education level: Not on file  Occupational History  . Not on file  Social Needs  . Financial resource strain: Not on file  . Food insecurity:    Worry: Not on file    Inability: Not on file  . Transportation needs:    Medical: Not on file    Non-medical: Not on file  Tobacco Use  . Smoking status: Never Smoker  . Smokeless tobacco: Never Used  Substance and Sexual Activity  . Alcohol use: Not on file  . Drug use: Not on file  . Sexual activity: Not on file  Lifestyle  . Physical activity:    Days per week: Not on file    Minutes per session: Not on file  . Stress: Not on file  Relationships  . Social connections:    Talks on phone: Not on file    Gets together: Not on file    Attends religious service: Not on file    Active member of club or organization: Not on file    Attends meetings of clubs or organizations: Not on file    Relationship status: Not on file  . Intimate partner violence:    Fear of current or ex partner: Not on file    Emotionally abused: Not on file    Physically abused: Not on file    Forced sexual activity: Not on file  Other Topics Concern  . Not on file  Social History Narrative  . Not on file     No family history of premature CAD in 1st degree relatives.  Current Meds  Medication Sig  . atorvastatin (LIPITOR) 20 MG tablet Take 20 mg by mouth daily.  . cholecalciferol (VITAMIN D) 1000 units tablet Take 2,000 Units by mouth daily.  . fluticasone (FLONASE) 50 MCG/ACT nasal spray Place 1 spray into both nostrils daily.  . furosemide (LASIX) 20 MG tablet  Take 20 mg by mouth daily.  Marland Kitchen levothyroxine (SYNTHROID, LEVOTHROID) 88 MCG tablet Take 88 mcg by mouth daily before breakfast.  . losartan (COZAAR) 50 MG tablet Take 50 mg by mouth daily.  . metoprolol succinate (TOPROL-XL) 25 MG 24 hr tablet Take 25 mg by mouth 2 (two) times daily.  . pantoprazole (PROTONIX) 40  MG tablet Take 40 mg by mouth daily.  . potassium chloride (K-DUR,KLOR-CON) 10 MEQ tablet Take 10 mEq by mouth daily.  . traMADol (ULTRAM) 50 MG tablet Take by mouth every 6 (six) hours as needed.      Review of systems complete and found to be negative unless listed above in HPI    Physical exam Blood pressure 98/62, pulse 69, height 5\' 3"  (1.6 m), weight 158 lb (71.7 kg), SpO2 98 %. General: NAD Neck: No JVD, no thyromegaly or thyroid nodule.  Lungs: Clear to auscultation bilaterally with normal respiratory effort. CV: Nondisplaced PMI. Regular rate and rhythm, normal S1/S2, no S3/S4, no murmur.  No peripheral edema.  No carotid bruit.   Abdomen: Soft, nontender, no distention.  Skin: Intact without lesions or rashes.  Neurologic: Alert and oriented x 3.  Psych: Normal affect. Extremities: No clubbing or cyanosis.  HEENT: Normal.   ECG: Most recent ECG reviewed.   Labs: No results found for: K, BUN, CREATININE, ALT, TSH, HGB   Lipids: No results found for: LDLCALC, LDLDIRECT, CHOL, TRIG, HDL      ASSESSMENT AND PLAN:  1.  Cardiomyopathy/chronic systolic heart failure, LVEF 20-25%: Currently on Lasix 20 mg daily, losartan 50 mg daily, and Toprol-XL 25 mg twice daily.  She is orthopneic and has bilateral ankle and feet swelling.  I will increase Lasix to 40 mg daily and increase potassium to 20 meq daily.  I will check a basic metabolic panel within the next few days.  I will discontinue losartan and start Entresto on the morning of 08/11/2017 at a dose of 24-26 mg twice daily.  An ischemic etiology needs to be ruled out.  She has a left bundle branch block.  I will  proceed with a nuclear myocardial perfusion imaging study to evaluate for ischemic heart disease (Lexiscan Myoview). I will check an echocardiogram after she has been on optimal medical therapy for several months.  2.  Hypertension: Blood pressure is low normal.  I will monitor given switch from losartan to Entresto.  3.  Hyperlipidemia: Continue Lipitor 20 mg daily.  4.  Left bundle branch block: Given her cardiomyopathy this is suspicious for coronary artery disease.  I will obtain a Lexiscan Myoview stress test.   Disposition: Follow up in 6 weeks  Signed: Prentice Docker, M.D., F.A.C.C.  08/10/2017, 10:15 AM

## 2017-08-10 NOTE — Patient Instructions (Addendum)
Medication Instructions:   Stop Losartan.   Begin Entresto 24/26mg  twice a day - 30 day free trial card given.  YOU MAY BEGIN ENTRESTO TOMORROW MORNING.   Increase Lasix to 40mg  daily.  Increase Potassium to daily.   Continue all other medications.    Labwork:  BMET - order given today.  Do in 3 days.  Office will contact with results via phone or letter.    Testing/Procedures:  Your physician has requested that you have a lexiscan myoview. For further information please visit [email or web address] Please follow instruction sheet, as given.  Office will contact with results via phone or letter.    Follow-Up: 6-8 weeks   Any Other Special Instructions Will Be Listed Below (If Applicable).  If you need a refill on your cardiac medications before your next appointment, please call your pharmacy.

## 2017-08-13 ENCOUNTER — Encounter (HOSPITAL_COMMUNITY): Admission: RE | Admit: 2017-08-13 | Payer: Medicare Other | Source: Ambulatory Visit

## 2017-08-13 ENCOUNTER — Encounter (HOSPITAL_COMMUNITY)
Admission: RE | Admit: 2017-08-13 | Discharge: 2017-08-13 | Disposition: A | Discharge status: Home / Self Care | Payer: Medicare Other | Source: Ambulatory Visit | Attending: Cardiovascular Disease | Admitting: Cardiovascular Disease

## 2017-08-13 DIAGNOSIS — I428 Other cardiomyopathies: Secondary | ICD-10-CM | POA: Insufficient documentation

## 2017-08-16 DIAGNOSIS — I428 Other cardiomyopathies: Secondary | ICD-10-CM | POA: Diagnosis not present

## 2017-08-16 DIAGNOSIS — I5022 Chronic systolic (congestive) heart failure: Secondary | ICD-10-CM | POA: Diagnosis not present

## 2017-08-17 LAB — BASIC METABOLIC PANEL
BUN/Creatinine Ratio: 12 (ref 12–28)
BUN: 13 mg/dL (ref 8–27)
CALCIUM: 10.9 mg/dL — AB (ref 8.7–10.3)
CHLORIDE: 92 mmol/L — AB (ref 96–106)
CO2: 23 mmol/L (ref 20–29)
Creatinine, Ser: 1.11 mg/dL — ABNORMAL HIGH (ref 0.57–1.00)
GFR calc Af Amer: 52 mL/min/{1.73_m2} — ABNORMAL LOW (ref >59)
GFR, EST NON AFRICAN AMERICAN: 45 mL/min/{1.73_m2} — AB (ref >59)
GLUCOSE: 111 mg/dL — AB (ref 65–99)
POTASSIUM: 4.2 mmol/L (ref 3.5–5.2)
SODIUM: 129 mmol/L — AB (ref 134–144)

## 2017-08-18 ENCOUNTER — Ambulatory Visit (HOSPITAL_COMMUNITY)
Admission: RE | Admit: 2017-08-18 | Discharge: 2017-08-18 | Disposition: A | Discharge status: Home / Self Care | Payer: Medicare Other | Source: Ambulatory Visit | Attending: Cardiovascular Disease | Admitting: Cardiovascular Disease

## 2017-08-18 ENCOUNTER — Encounter (HOSPITAL_COMMUNITY)
Admission: RE | Admit: 2017-08-18 | Discharge: 2017-08-18 | Disposition: A | Discharge status: Home / Self Care | Payer: Medicare Other | Source: Ambulatory Visit | Attending: Cardiovascular Disease | Admitting: Cardiovascular Disease

## 2017-08-18 ENCOUNTER — Encounter: Payer: Self-pay | Admitting: *Deleted

## 2017-08-18 DIAGNOSIS — I428 Other cardiomyopathies: Secondary | ICD-10-CM | POA: Insufficient documentation

## 2017-08-18 DIAGNOSIS — R9439 Abnormal result of other cardiovascular function study: Secondary | ICD-10-CM | POA: Insufficient documentation

## 2017-08-18 LAB — NM MYOCAR MULTI W/SPECT W/WALL MOTION / EF
CHL CUP NUCLEAR SSS: 20
CHL CUP RESTING HR STRESS: 72 {beats}/min
LHR: 0.45
LV sys vol: 86 mL
LVDIAVOL: 142 mL (ref 46–106)
NUC STRESS TID: 1.25
Peak HR: 130 {beats}/min
SDS: 7
SRS: 13

## 2017-08-18 MED ORDER — TECHNETIUM TC 99M TETROFOSMIN IV KIT
10.0000 | PACK | Freq: Once | INTRAVENOUS | Status: AC | PRN
  Administered 2017-08-18: 9.3 via INTRAVENOUS

## 2017-08-18 MED ORDER — REGADENOSON 0.4 MG/5ML IV SOLN
INTRAVENOUS | Status: AC
  Administered 2017-08-18: 0.4 mg via INTRAVENOUS
  Filled 2017-08-18: qty 5

## 2017-08-18 MED ORDER — TECHNETIUM TC 99M TETROFOSMIN IV KIT
30.0000 | PACK | Freq: Once | INTRAVENOUS | Status: AC | PRN
  Administered 2017-08-18: 29 via INTRAVENOUS

## 2017-08-18 MED ORDER — SODIUM CHLORIDE 0.9% FLUSH
INTRAVENOUS | Status: AC
  Administered 2017-08-18: 10 mL via INTRAVENOUS
  Filled 2017-08-18: qty 10

## 2017-08-19 ENCOUNTER — Encounter: Payer: Self-pay | Admitting: *Deleted

## 2017-08-19 ENCOUNTER — Ambulatory Visit: Payer: Self-pay | Admitting: Cardiology

## 2017-08-19 ENCOUNTER — Telehealth: Payer: Self-pay | Admitting: *Deleted

## 2017-08-19 DIAGNOSIS — R7989 Other specified abnormal findings of blood chemistry: Secondary | ICD-10-CM

## 2017-08-19 MED ORDER — FUROSEMIDE 40 MG PO TABS: 20.0000 mg | ORAL_TABLET | Freq: Every day | ORAL | Status: DC

## 2017-08-19 MED ORDER — POTASSIUM CHLORIDE CRYS ER 10 MEQ PO TBCR: 10.0000 meq | EXTENDED_RELEASE_TABLET | Freq: Every day | ORAL | Status: DC

## 2017-08-19 MED ORDER — ASPIRIN EC 81 MG PO TBEC: 81.0000 mg | DELAYED_RELEASE_TABLET | Freq: Every day | ORAL | Status: AC

## 2017-08-19 NOTE — Telephone Encounter (Signed)
Notes recorded by Lesle Chris, LPN on 9/83/3825 at 5:48 PM EDT Alinda Money (son) notified. Copy to pmd. Follow up scheduled for 09/22/2017 with Dr. Purvis Sheffield in [place]. ------  Notes recorded by Laqueta Linden, MD on 08/19/2017 at 11:34 AM EDT There is evidence of prior heart attack with possible blockage. Start ASA 81 mg daily. Get copy of CBC from PCP.

## 2017-08-19 NOTE — Telephone Encounter (Signed)
Notes recorded by Lesle Chris, LPN on 4/80/1655 at 9:26 AM EDT Son Alinda Money) notified. Copy to pmd. Follow up scheduled for 09/22/2017 with Dr. Purvis Sheffield in [place]. Will mail lab order to pt's home - she will do at pmd office. ------  Notes recorded by Laqueta Linden, MD on 08/17/2017 at 11:22 AM EDT Sodium is low and creatinine is mildly elevated. Let us try reducing Lasix back to 20 mg and potassium back to 10 mEq daily and repeat a basic metabolic panel in a week.

## 2017-08-23 DIAGNOSIS — R000 Tachycardia, unspecified: Secondary | ICD-10-CM | POA: Diagnosis not present

## 2017-08-23 DIAGNOSIS — I10 Essential (primary) hypertension: Secondary | ICD-10-CM | POA: Diagnosis not present

## 2017-08-25 ENCOUNTER — Encounter (HOSPITAL_COMMUNITY): Payer: Self-pay | Admitting: Emergency Medicine

## 2017-08-25 ENCOUNTER — Other Ambulatory Visit: Payer: Self-pay

## 2017-08-25 ENCOUNTER — Emergency Department (HOSPITAL_COMMUNITY)
Admission: EM | Admit: 2017-08-25 | Discharge: 2017-08-25 | Disposition: A | Discharge status: Home / Self Care | Payer: Medicare Other | Attending: Emergency Medicine | Admitting: Emergency Medicine

## 2017-08-25 DIAGNOSIS — K5641 Fecal impaction: Secondary | ICD-10-CM | POA: Diagnosis not present

## 2017-08-25 DIAGNOSIS — Z79899 Other long term (current) drug therapy: Secondary | ICD-10-CM | POA: Diagnosis not present

## 2017-08-25 DIAGNOSIS — Z7982 Long term (current) use of aspirin: Secondary | ICD-10-CM | POA: Diagnosis not present

## 2017-08-25 DIAGNOSIS — I10 Essential (primary) hypertension: Secondary | ICD-10-CM | POA: Insufficient documentation

## 2017-08-25 DIAGNOSIS — K5900 Constipation, unspecified: Secondary | ICD-10-CM | POA: Diagnosis present

## 2017-08-25 MED ORDER — SENNOSIDES-DOCUSATE SODIUM 8.6-50 MG PO TABS: 2.0000 | ORAL_TABLET | Freq: Every day | ORAL | 0 refills | Status: AC

## 2017-08-25 MED ORDER — SORBITOL 70 % SOLN
960.0000 mL | TOPICAL_OIL | Freq: Once | ORAL | Status: AC
  Administered 2017-08-25: 960 mL via RECTAL
  Filled 2017-08-25: qty 473

## 2017-08-25 NOTE — ED Provider Notes (Signed)
[facility] EMERGENCY DEPARTMENT Provider Note   CSN: [id number] Arrival date & time: 08/25/17  1015     History   Chief Complaint Chief Complaint  Patient presents with  . Constipation    HPI Danielle Bell is a 84 y.o. female.  HPI Patient presents with concern of abdominal discomfort, constipation. She notes that she has a history of recurrent constipation, and her last bowel movement was a few days ago. When this occurs she typically did begin taking MiraLAX, which she did yesterday. Since that time she has had worsening lower abdominal discomfort, in spite of attempts at disimpacting herself manually, using suppository, and the aforementioned oral medication. No fever, no chills, no vomiting.  Past Medical History:  Diagnosis Date  . Actinic keratosis   . Degenerative joint disease   . Gout   . Hearing loss   . Hearing loss   . High cholesterol   . Hypercalcemia   . Hypertension   . Keratosis, seborrheic   . LBBB (left bundle branch block)   . Premature beats   . Primary ovarian failure   . Renal insufficiency syndrome   . Shoulder arthritis   . Tachycardia   . Vitamin D deficiency     There are no active problems to display for this patient.   Past Surgical History:  Procedure Laterality Date  . APPENDECTOMY    . COLONOSCOPY    . macrocytosis       OB History   None      Home Medications    Prior to Admission medications   Medication Sig Start Date End Date Taking? Authorizing Provider  aspirin EC 81 MG tablet Take 1 tablet (81 mg total) by mouth daily. Patient taking differently: Take 81 mg by mouth at bedtime.  08/19/17  Yes Laqueta Linden, MD  atorvastatin (LIPITOR) 20 MG tablet Take 20 mg by mouth daily.   Yes [provider]  cholecalciferol (VITAMIN D) 1000 units tablet Take 2,000 Units by mouth daily.   Yes [provider]  fluticasone (FLONASE) 50 MCG/ACT nasal spray Place 1 spray into both nostrils daily.    Yes [provider]  furosemide (LASIX) 40 MG tablet Take 0.5 tablets (20 mg total) by mouth daily. 08/19/17  Yes Laqueta Linden, MD  levothyroxine (SYNTHROID, LEVOTHROID) 88 MCG tablet Take 88 mcg by mouth daily before breakfast.   Yes [provider]  metoprolol succinate (TOPROL-XL) 25 MG 24 hr tablet Take 25 mg by mouth 2 (two) times daily.   Yes [provider]  pantoprazole (PROTONIX) 40 MG tablet Take 40 mg by mouth daily.   Yes [provider]  potassium chloride (K-DUR,KLOR-CON) 10 MEQ tablet Take 1 tablet (10 mEq total) by mouth daily. 08/19/17  Yes Laqueta Linden, MD  sacubitril-valsartan (ENTRESTO) 24-26 MG Take 1 tablet by mouth 2 (two) times daily. 08/10/17  Yes Laqueta Linden, MD  traMADol (ULTRAM) 50 MG tablet Take by mouth every 6 (six) hours as needed.   Yes [provider]    Family History Family History  Problem Relation Age of Onset  . Glaucoma Sister   . Hypertension Brother   . Glaucoma Sister   . Chronic Renal Failure Sister   . Hypertension Sister     Social History Social History   Tobacco Use  . Smoking status: Never Smoker  . Smokeless tobacco: Never Used  Substance Use Topics  . Alcohol use: Not on file  . Drug  use: Not on file     Allergies   Asa [aspirin]; Ciprofloxacin; Hctz [hydrochlorothiazide]; Lescol xl [fluvastatin]; and Sulfa antibiotics   Review of Systems Review of Systems  Constitutional:       Per HPI, otherwise negative  HENT:       Per HPI, otherwise negative  Respiratory:       Per HPI, otherwise negative  Cardiovascular:       Per HPI, otherwise negative  Gastrointestinal: Negative for vomiting.  Endocrine:       Negative aside from HPI  Genitourinary:       Neg aside from HPI   Musculoskeletal:       Per HPI, otherwise negative  Skin: Negative.   Neurological: Positive for weakness. Negative for syncope.     Physical Exam Updated Vital Signs BP 106/72  (BP Location: Right Arm)   Pulse 84   Temp 98.3 F (36.8 C) (Oral)   Resp 16   SpO2 97%   Physical Exam  Constitutional: She is oriented to person, place, and time. She has a sickly appearance. No distress.  HENT:  Head: Normocephalic and atraumatic.  Eyes: Conjunctivae and EOM are normal.  Cardiovascular: Normal rate and regular rhythm.  Pulmonary/Chest: Effort normal and breath sounds normal. No stridor. No respiratory distress.  Abdominal: She exhibits no distension.    Musculoskeletal: She exhibits no edema.  Neurological: She is alert and oriented to person, place, and time. No cranial nerve deficit.  Skin: Skin is warm and dry.  Psychiatric: She has a normal mood and affect.  Nursing note and vitals reviewed.    ED Treatments / Results  Labs (all labs ordered are listed, but only abnormal results are displayed) Labs Reviewed - No data to display  EKG None  Radiology No results found.  Procedures Procedures (including critical care time)  Medications Ordered in ED Medications  sorbitol, milk of mag, mineral oil, glycerin (SMOG) enema (960 mLs Rectal Given 08/25/17 1427)     Initial Impression / Assessment and Plan / ED Course  I have reviewed the triage vital signs and the nursing notes.  Pertinent labs & imaging results that were available during my care of the patient were reviewed by me and considered in my medical decision making (see chart for details).     4:40 PM Patient has had relief following 2 enema treatments. She requests discharge. She has a scheduled follow-up tomorrow with her physician, was encouraged to keep this appointment. In the interim she will start daily stool softener.  Final Clinical Impressions(s) / ED Diagnoses  Stool impaction   Gerhard Munch, MD 08/25/17 [phone]

## 2017-08-25 NOTE — Discharge Instructions (Addendum)
As discussed, it is important that you take the newly prescribed stool softener in addition to your MiraLAX daily to facilitate good bowel habits. Please discuss this with your physician tomorrow.  Return here for concerning changes in your condition.

## 2017-08-25 NOTE — ED Triage Notes (Signed)
Pt c/o impacted. Hx of. A/o. Burning and pain to rectum. States it is soft. Pt has been manually impacting herself. Has done enema also and stool softeners.

## 2017-08-25 NOTE — ED Notes (Signed)
Pt went to the restroom and felt like she was able to pass a "decent amount of stool, but not all of it".

## 2017-08-25 NOTE — ED Notes (Signed)
Pt went to the restroom again and felt like "nothing came out this time but gas and the liquid from the enema". Pt feels good relief.

## 2017-08-26 ENCOUNTER — Emergency Department (HOSPITAL_COMMUNITY)
Admission: EM | Admit: 2017-08-26 | Discharge: 2017-08-26 | Disposition: A | Discharge status: Home / Self Care | Payer: Medicare Other | Attending: Emergency Medicine | Admitting: Emergency Medicine

## 2017-08-26 ENCOUNTER — Encounter (HOSPITAL_COMMUNITY): Payer: Self-pay | Admitting: Emergency Medicine

## 2017-08-26 ENCOUNTER — Other Ambulatory Visit: Payer: Self-pay

## 2017-08-26 DIAGNOSIS — K625 Hemorrhage of anus and rectum: Secondary | ICD-10-CM | POA: Diagnosis not present

## 2017-08-26 DIAGNOSIS — K5900 Constipation, unspecified: Secondary | ICD-10-CM | POA: Diagnosis not present

## 2017-08-26 DIAGNOSIS — Z7982 Long term (current) use of aspirin: Secondary | ICD-10-CM | POA: Insufficient documentation

## 2017-08-26 DIAGNOSIS — I10 Essential (primary) hypertension: Secondary | ICD-10-CM | POA: Insufficient documentation

## 2017-08-26 DIAGNOSIS — Z6827 Body mass index (BMI) 27.0-27.9, adult: Secondary | ICD-10-CM | POA: Diagnosis not present

## 2017-08-26 DIAGNOSIS — Z299 Encounter for prophylactic measures, unspecified: Secondary | ICD-10-CM | POA: Diagnosis not present

## 2017-08-26 DIAGNOSIS — T148XXA Other injury of unspecified body region, initial encounter: Secondary | ICD-10-CM | POA: Diagnosis not present

## 2017-08-26 DIAGNOSIS — M722 Plantar fascial fibromatosis: Secondary | ICD-10-CM | POA: Diagnosis not present

## 2017-08-26 DIAGNOSIS — Z79899 Other long term (current) drug therapy: Secondary | ICD-10-CM | POA: Diagnosis not present

## 2017-08-26 DIAGNOSIS — K921 Melena: Secondary | ICD-10-CM | POA: Diagnosis present

## 2017-08-26 LAB — I-STAT CHEM 8, ED
BUN: 23 mg/dL (ref 8–23)
CALCIUM ION: 1.36 mmol/L (ref 1.15–1.40)
CREATININE: 1.3 mg/dL — AB (ref 0.44–1.00)
Chloride: 94 mmol/L — ABNORMAL LOW (ref 98–111)
GLUCOSE: 118 mg/dL — AB (ref 70–99)
HCT: 44 % (ref 36.0–46.0)
HEMOGLOBIN: 15 g/dL (ref 12.0–15.0)
POTASSIUM: 4.1 mmol/L (ref 3.5–5.1)
Sodium: 130 mmol/L — ABNORMAL LOW (ref 135–145)
TCO2: 25 mmol/L (ref 22–32)

## 2017-08-26 LAB — POC OCCULT BLOOD, ED: Fecal Occult Bld: POSITIVE — AB

## 2017-08-26 NOTE — ED Triage Notes (Addendum)
Pt reports was seen yesterday for fecal impaction. Pt reports received enema and discharged home with senokot. Pt reports during the night last night reports "bloody mucous" when have BM. Pt reports initially blood was bright red but reports is now dark. Pt denies being on  A blood thinner,shortness of breath, chest/abd pain,dizziness. nad noted.

## 2017-08-26 NOTE — Discharge Instructions (Addendum)
He was seen today for some rectal bleeding.  This is likely related to recent disimpaction and enemas.  Monitor closely.  If you note that you have increased bleeding, dizziness, any new or worsening symptoms you should be reevaluated immediately.

## 2017-08-26 NOTE — ED Provider Notes (Signed)
[facility] EMERGENCY DEPARTMENT Provider Note   CSN: [id number] Arrival date & time: 08/26/17  5852     History   Chief Complaint Chief Complaint  Patient presents with  . Rectal Bleeding    HPI Danielle Bell is a 84 y.o. female.  HPI  This is an 84 year old female who presents with bloody stools.  Patient was seen and evaluated yesterday for constipation and fecal impaction.  She was disimpacted and received an enema.  She states that she had relief and was discharged home.  She did well at home until last night when she had the urge to go to the bathroom.  She reports that she has had multiple bowel movements that have consisted just of "mucus and blood."  She reports more than streaking blood but not massive amounts of blood.  Denies dizziness.  Takes a baby aspirin daily but no other blood thinners.  She denies any abdominal pain.  Does report some rectal pain but currently reports that she is comfortable.  No history of GI bleeds.  Past Medical History:  Diagnosis Date  . Actinic keratosis   . Degenerative joint disease   . Gout   . Hearing loss   . Hearing loss   . High cholesterol   . Hypercalcemia   . Hypertension   . Keratosis, seborrheic   . LBBB (left bundle branch block)   . Premature beats   . Primary ovarian failure   . Renal insufficiency syndrome   . Shoulder arthritis   . Tachycardia   . Vitamin D deficiency     There are no active problems to display for this patient.   Past Surgical History:  Procedure Laterality Date  . APPENDECTOMY    . COLONOSCOPY    . macrocytosis       OB History   None      Home Medications    Prior to Admission medications   Medication Sig Start Date End Date Taking? Authorizing Provider  aspirin EC 81 MG tablet Take 1 tablet (81 mg total) by mouth daily. Patient taking differently: Take 81 mg by mouth at bedtime.  08/19/17   Laqueta Linden, MD  atorvastatin (LIPITOR) 20 MG tablet Take 20 mg by mouth  daily.    [provider]  cholecalciferol (VITAMIN D) 1000 units tablet Take 2,000 Units by mouth daily.    [provider]  fluticasone (FLONASE) 50 MCG/ACT nasal spray Place 1 spray into both nostrils daily.    [provider]  furosemide (LASIX) 40 MG tablet Take 0.5 tablets (20 mg total) by mouth daily. 08/19/17   Laqueta Linden, MD  levothyroxine (SYNTHROID, LEVOTHROID) 88 MCG tablet Take 88 mcg by mouth daily before breakfast.    [provider]  metoprolol succinate (TOPROL-XL) 25 MG 24 hr tablet Take 25 mg by mouth 2 (two) times daily.    [provider]  pantoprazole (PROTONIX) 40 MG tablet Take 40 mg by mouth daily.    [provider]  potassium chloride (K-DUR,KLOR-CON) 10 MEQ tablet Take 1 tablet (10 mEq total) by mouth daily. 08/19/17   Laqueta Linden, MD  sacubitril-valsartan (ENTRESTO) 24-26 MG Take 1 tablet by mouth 2 (two) times daily. 08/10/17   Laqueta Linden, MD  senna-docusate (SENOKOT-S) 8.6-50 MG tablet Take 2 tablets by mouth daily for 10 days. 08/25/17 09/04/17  Gerhard Munch, MD  traMADol (ULTRAM) 50 MG tablet Take by mouth every 6 (six) hours as needed.  [provider]    Family History Family History  Problem Relation Age of Onset  . Glaucoma Sister   . Hypertension Brother   . Glaucoma Sister   . Chronic Renal Failure Sister   . Hypertension Sister     Social History Social History   Tobacco Use  . Smoking status: Never Smoker  . Smokeless tobacco: Never Used  Substance Use Topics  . Alcohol use: Not on file  . Drug use: Not on file     Allergies   Asa [aspirin]; Ciprofloxacin; Hctz [hydrochlorothiazide]; Lescol xl [fluvastatin]; and Sulfa antibiotics   Review of Systems Review of Systems  Constitutional: Negative for fever.  Respiratory: Negative for shortness of breath.   Cardiovascular: Negative for chest pain.  Gastrointestinal: Positive for blood in stool and  constipation. Negative for abdominal pain, nausea and vomiting.  Genitourinary: Negative for dysuria.  Neurological: Negative for dizziness.  All other systems reviewed and are negative.    Physical Exam Updated Vital Signs BP (!) 99/51   Pulse 66   Temp 98.6 F (37 C) (Oral)   Resp 16   Ht 5\' 3"  (1.6 m)   Wt 68 kg   SpO2 99%   BMI 26.57 kg/m   Physical Exam  Constitutional: She is oriented to person, place, and time. She appears well-developed and well-nourished.  Elderly but nontoxic-appearing, no acute distress  HENT:  Head: Normocephalic and atraumatic.  Eyes: Pupils are equal, round, and reactive to light.  Cardiovascular: Normal rate, regular rhythm and normal heart sounds.  Pulmonary/Chest: Effort normal and breath sounds normal. No respiratory distress. She has no wheezes.  Abdominal: Soft. There is no tenderness.  Genitourinary: Rectal exam shows guaiac positive stool.  Genitourinary Comments: No gross blood  Neurological: She is alert and oriented to person, place, and time.  Skin: Skin is warm and dry.  Psychiatric: She has a normal mood and affect.  Nursing note and vitals reviewed.    ED Treatments / Results  Labs (all labs ordered are listed, but only abnormal results are displayed) Labs Reviewed  I-STAT CHEM 8, ED - Abnormal; Notable for the following components:      Result Value   Sodium 130 (*)    Chloride 94 (*)    Creatinine, Ser 1.30 (*)    Glucose, Bld 118 (*)    All other components within normal limits  POC OCCULT BLOOD, ED - Abnormal; Notable for the following components:   Fecal Occult Bld POSITIVE (*)    All other components within normal limits    EKG None  Radiology No results found.  Procedures Procedures (including critical care time)  Medications Ordered in ED Medications - No data to display   Initial Impression / Assessment and Plan / ED Course  I have reviewed the triage vital signs and the nursing  notes.  Pertinent labs & imaging results that were available during my care of the patient were reviewed by me and considered in my medical decision making (see chart for details).     Patient presents with reported bloody stools.  Recent disimpaction and enemas yesterday.  She is overall nontoxic-appearing and vital signs are reassuring.  Denies dizziness.  She has no gross blood on exam.  No evidence of external hemorrhoids.  She is Hemoccult positive.  I suspect her symptoms are likely related to recent disimpaction and enema.  Will obtain Chem-8 for hemoglobin.  At this time do not suspect significant GI bleed.  Her abdominal  exam is reassuring.  Suspect symptoms are related to recent disimpaction and enema.  8:23 AM Patient's hemoglobin 15.  No signs of active bleeding.  Suspect patient symptoms are related to yesterday's treatment.  I have encouraged her to monitor her symptoms closely.  If she has increasing bleeding, pain, dizziness, she should be reevaluated immediately.  After history, exam, and medical workup I feel the patient has been appropriately medically screened and is safe for discharge home. Pertinent diagnoses were discussed with the patient. Patient was given return precautions.   Final Clinical Impressions(s) / ED Diagnoses   Final diagnoses:  Rectal bleeding    ED Discharge Orders    None       Shon Baton, MD 08/26/17 [id number]

## 2017-09-02 ENCOUNTER — Other Ambulatory Visit: Payer: Self-pay | Admitting: *Deleted

## 2017-09-02 MED ORDER — SACUBITRIL-VALSARTAN 24-26 MG PO TABS: 1.0000 | ORAL_TABLET | Freq: Two times a day (BID) | ORAL | 3 refills | Status: DC

## 2017-09-07 ENCOUNTER — Telehealth: Payer: Self-pay | Admitting: *Deleted

## 2017-09-07 DIAGNOSIS — E876 Hypokalemia: Secondary | ICD-10-CM

## 2017-09-07 DIAGNOSIS — R7989 Other specified abnormal findings of blood chemistry: Secondary | ICD-10-CM

## 2017-09-07 NOTE — Telephone Encounter (Signed)
Weights for the last week - 145, 143, 144,145,144, & 145lbs this morning.

## 2017-09-07 NOTE — Telephone Encounter (Signed)
Notes recorded by Lesle Chris, LPN on 0/03/5007 at 9:48 AM EDT Patient & son Alinda Money) notified. Copy to pmd. They will do lab at [facility] across from [facility]. ------  Notes recorded by Laqueta Linden, MD on 08/31/2017 at 4:58 PM EDT No, continue 20 mg Lasix. ------  Notes recorded by Lesle Chris, LPN on 3/81/8299 at 3:57 PM EDT Son Alinda Money) notified. Stated she is already taking 20mg  on the Laxix. She has 40mg  tab that she breaks in half. Do you want her to cut back to 10mg  on the Lasix ? ------  Notes recorded by Laqueta Linden, MD on 08/28/2017 at 11:12 AM EDT Creatinine mildly elevated, sodium mildly low. Have her weigh herself daily. Have her try reducing Lasix back to 20 mg and reduce KCl dose in half as well. Let's see if we can keep symptoms stable on lower dose of Lasix. After she has done this, repeat BMET one week afterwards.

## 2017-09-08 DIAGNOSIS — R7989 Other specified abnormal findings of blood chemistry: Secondary | ICD-10-CM | POA: Diagnosis not present

## 2017-09-08 DIAGNOSIS — E876 Hypokalemia: Secondary | ICD-10-CM | POA: Diagnosis not present

## 2017-09-08 LAB — BASIC METABOLIC PANEL
BUN/Creatinine Ratio: 14 (calc) (ref 6–22)
BUN: 14 mg/dL (ref 7–25)
CALCIUM: 10.5 mg/dL — AB (ref 8.6–10.4)
CO2: 28 mmol/L (ref 20–32)
CREATININE: 0.99 mg/dL — AB (ref 0.60–0.88)
Chloride: 98 mmol/L (ref 98–110)
Glucose, Bld: 115 mg/dL (ref 65–139)
POTASSIUM: 4.3 mmol/L (ref 3.5–5.3)
Sodium: 132 mmol/L — ABNORMAL LOW (ref 135–146)

## 2017-09-17 ENCOUNTER — Telehealth: Payer: Self-pay | Admitting: *Deleted

## 2017-09-17 NOTE — Telephone Encounter (Signed)
Notes recorded by Lesle Chris, LPN on 9/67/8938 at 10:19 AM EDT Son Alinda Money) notified. Copy to pmd. Follow up scheduled for 09/22/2017. ------  Notes recorded by Antoine Poche, MD on 09/10/2017 at 12:36 PM EDT Covering for Dr Kirtland Bouchard, labs look good   Dominga Ferry MD

## 2017-09-22 ENCOUNTER — Ambulatory Visit (INDEPENDENT_AMBULATORY_CARE_PROVIDER_SITE_OTHER): Payer: Medicare Other | Admitting: Cardiovascular Disease

## 2017-09-22 ENCOUNTER — Other Ambulatory Visit: Payer: Self-pay | Admitting: Cardiovascular Disease

## 2017-09-22 ENCOUNTER — Encounter: Payer: Self-pay | Admitting: Cardiovascular Disease

## 2017-09-22 ENCOUNTER — Telehealth: Payer: Self-pay | Admitting: Cardiovascular Disease

## 2017-09-22 ENCOUNTER — Encounter: Payer: Self-pay | Admitting: *Deleted

## 2017-09-22 VITALS — BP 112/62 | HR 66 | Ht 63.0 in | Wt 153.0 lb

## 2017-09-22 DIAGNOSIS — I428 Other cardiomyopathies: Secondary | ICD-10-CM

## 2017-09-22 DIAGNOSIS — Z01812 Encounter for preprocedural laboratory examination: Secondary | ICD-10-CM | POA: Diagnosis not present

## 2017-09-22 DIAGNOSIS — I10 Essential (primary) hypertension: Secondary | ICD-10-CM | POA: Diagnosis not present

## 2017-09-22 DIAGNOSIS — I519 Heart disease, unspecified: Secondary | ICD-10-CM | POA: Diagnosis not present

## 2017-09-22 DIAGNOSIS — I25118 Atherosclerotic heart disease of native coronary artery with other forms of angina pectoris: Secondary | ICD-10-CM

## 2017-09-22 DIAGNOSIS — E785 Hyperlipidemia, unspecified: Secondary | ICD-10-CM

## 2017-09-22 DIAGNOSIS — R9439 Abnormal result of other cardiovascular function study: Secondary | ICD-10-CM

## 2017-09-22 DIAGNOSIS — I447 Left bundle-branch block, unspecified: Secondary | ICD-10-CM | POA: Diagnosis not present

## 2017-09-22 DIAGNOSIS — I5022 Chronic systolic (congestive) heart failure: Secondary | ICD-10-CM

## 2017-09-22 MED ORDER — POTASSIUM CHLORIDE ER 10 MEQ PO TBCR: 10.0000 meq | EXTENDED_RELEASE_TABLET | Freq: Every day | ORAL | 6 refills | Status: DC

## 2017-09-22 MED ORDER — METOPROLOL SUCCINATE ER 25 MG PO TB24: 12.5000 mg | ORAL_TABLET | Freq: Two times a day (BID) | ORAL | 6 refills | Status: DC

## 2017-09-22 NOTE — H&P (View-Only) (Signed)
SUBJECTIVE: The patient presents for follow-up of cardiomyopathy, chronic systolic heart failure, and left bundle branch block.  Nuclear stress test demonstrated an anterior/anterolateral defect exhibiting partial reversibility in the mid to apical level consistent with scar and mild to moderate peri-infarct ischemia.  There was a fixed inferior defect as well that may have represented soft tissue attenuation although scar could not entirely be excluded.  I previously reviewed an echocardiogram report dated 08/02/2017 which showed severely reduced left ventricular systolic function, LVEF 20 to 16%, grade 2 diastolic dysfunction, moderate mitral and mild to moderate aortic, pulmonic, and tricuspid regurgitation with moderate pulmonary hypertension, PASP 45-50 mmHg.  I increased Lasix to 40 mg at her last visit but she developed some hyponatremia and acute renal insufficiency so I reduced Lasix to 20 mg.  Sodium was 129 and creatinine was 1.11 on 08/16/2017.  Since her last visit with me she was evaluated in the ED for constipation and rectal bleeding.  Basic metabolic panel on 09/08/2017 showed creatinine 0.99 and sodium 132.  Weight is down 5 pounds since 08/10/2017.  She is on Toprol-XL 25 mg twice daily and Entresto 24-26 mg twice daily.  She is here with her son, Alinda Money.  The patient denies chest pain but feels markedly fatigued and has exertional dyspnea and diaphoresis.  She feels cold at times.  She feels washed out after taking MiraLAX.  I reviewed her blood pressure log which showed blood pressures which have gotten as low as 71/42 and at least a few readings in the 90/50 range.  She describes a "sinking feeling "between 7 and 10 AM where she feels short of breath.    Review of Systems: As per "subjective", otherwise negative.  Allergies  Allergen Reactions  . Asa [Aspirin]     Epigastric discomfort; hives   . Ciprofloxacin     Headache   . Hctz [Hydrochlorothiazide]    Possible high calcium   . Lescol Xl [Fluvastatin]     Reflux   . Sulfa Antibiotics     abd upset     Current Outpatient Medications  Medication Sig Dispense Refill  . aspirin EC 81 MG tablet Take 1 tablet (81 mg total) by mouth daily. (Patient taking differently: Take 81 mg by mouth at bedtime. )    . atorvastatin (LIPITOR) 20 MG tablet Take 20 mg by mouth daily.    . cholecalciferol (VITAMIN D) 1000 units tablet Take 2,000 Units by mouth daily.    . fluticasone (FLONASE) 50 MCG/ACT nasal spray Place 1 spray into both nostrils daily.    . furosemide (LASIX) 40 MG tablet Take 0.5 tablets (20 mg total) by mouth daily.    Marland Kitchen levothyroxine (SYNTHROID, LEVOTHROID) 88 MCG tablet Take 88 mcg by mouth daily before breakfast.    . metoprolol succinate (TOPROL-XL) 25 MG 24 hr tablet Take 25 mg by mouth 2 (two) times daily.    . pantoprazole (PROTONIX) 40 MG tablet Take 40 mg by mouth daily.    . Polyethylene Glycol 3350 (MIRALAX PO) Take by mouth.    . potassium chloride (K-DUR) 10 MEQ tablet Take 5 mEq by mouth daily.    . sacubitril-valsartan (ENTRESTO) 24-26 MG Take 1 tablet by mouth 2 (two) times daily. 180 tablet 3  . traMADol (ULTRAM) 50 MG tablet Take by mouth every 6 (six) hours as needed.     No current facility-administered medications for this visit.     Past Medical History:  Diagnosis Date  .  Actinic keratosis   . Degenerative joint disease   . Gout   . Hearing loss   . Hearing loss   . High cholesterol   . Hypercalcemia   . Hypertension   . Keratosis, seborrheic   . LBBB (left bundle branch block)   . Premature beats   . Primary ovarian failure   . Renal insufficiency syndrome   . Shoulder arthritis   . Tachycardia   . Vitamin D deficiency     Past Surgical History:  Procedure Laterality Date  . APPENDECTOMY    . COLONOSCOPY    . macrocytosis      Social History   Socioeconomic History  . Marital status: Married    Spouse name: Not on file  . Number of  children: Not on file  . Years of education: Not on file  . Highest education level: Not on file  Occupational History  . Not on file  Social Needs  . Financial resource strain: Not on file  . Food insecurity:    Worry: Not on file    Inability: Not on file  . Transportation needs:    Medical: Not on file    Non-medical: Not on file  Tobacco Use  . Smoking status: Never Smoker  . Smokeless tobacco: Never Used  Substance and Sexual Activity  . Alcohol use: Not on file  . Drug use: Not on file  . Sexual activity: Not on file  Lifestyle  . Physical activity:    Days per week: Not on file    Minutes per session: Not on file  . Stress: Not on file  Relationships  . Social connections:    Talks on phone: Not on file    Gets together: Not on file    Attends religious service: Not on file    Active member of club or organization: Not on file    Attends meetings of clubs or organizations: Not on file    Relationship status: Not on file  . Intimate partner violence:    Fear of current or ex partner: Not on file    Emotionally abused: Not on file    Physically abused: Not on file    Forced sexual activity: Not on file  Other Topics Concern  . Not on file  Social History Narrative  . Not on file     Vitals:   09/22/17 1000  BP: 112/62  Pulse: 66  SpO2: 98%  Weight: 153 lb (69.4 kg)  Height: 5\' 3"  (1.6 m)    Wt Readings from Last 3 Encounters:  09/22/17 153 lb (69.4 kg)  08/26/17 150 lb (68 kg)  08/10/17 158 lb (71.7 kg)     PHYSICAL EXAM General: NAD HEENT: Normal. Neck: No JVD, no thyromegaly. Lungs: Clear to auscultation bilaterally with normal respiratory effort. CV: Regular rate and rhythm, normal S1/paradoxically split S2, no S3/S4, no murmur. No pretibial or periankle edema.  Abdomen: Soft, nontender, no distention.  Neurologic: Alert and oriented.  Psych: Normal affect. Skin: Normal. Musculoskeletal: No gross deformities.    ECG: Reviewed above  under Subjective   Labs: Lab Results  Component Value Date/Time   K 4.3 09/08/2017 08:36 AM   BUN 14 09/08/2017 08:36 AM   BUN 13 08/16/2017 08:53 AM   CREATININE 0.99 (H) 09/08/2017 08:36 AM   HGB 15.0 08/26/2017 07:35 AM     Lipids: No results found for: LDLCALC, LDLDIRECT, CHOL, TRIG, HDL     ASSESSMENT AND PLAN: 1.  Cardiomyopathy/chronic systolic heart failure, LVEF 20-25%: Nuclear stress test reviewed above suggestive of an ischemic etiology for cardiomyopathy.  She has continued exertional dyspnea and has not noticed a change in symptoms with Entresto.  She has gotten hypotensive and symptomatic from this.  I will reduce Toprol-XL to 12.5 mg twice daily and I will discontinue Entresto altogether.  Continue Lasix 20 mg daily and increase potassium to 10 mEq daily. I will arrange for left heart catheterization and coronary angiography. If blood pressure increases over time, I may be able to reintroduce an ACE inhibitor or angiotensin receptor blocker at a very low dose. I will check an echocardiogram if medical therapy is able to be optimized for several months.  2.  Hypertension: Blood pressure is normal.  However, she has become markedly hypotensive on several occasions.  I will stop Entresto and reduce Toprol-XL to 12.5 mg twice daily.  3.  Hyperlipidemia: Continue Lipitor 20 mg daily.  4.  Left bundle branch block: Nuclear stress test reviewed above with suspicion for coronary artery disease.  5.  Coronary artery disease: Nuclear stress test reviewed above suggestive of ischemic heart disease.  She is on aspirin, beta-blocker, and atorvastatin.  I will arrange for left heart catheterization and coronary angiography. Risks and benefits of cardiac catheterization have been discussed with the patient.  These include bleeding, infection, kidney damage, stroke, heart attack, death.  The patient understands these risks and is willing to proceed.    Disposition: Follow up after  cath  A high level of decision making was required for increased medical complexities.    Prentice Docker, M.D., F.A.C.C.

## 2017-09-22 NOTE — Addendum Note (Signed)
Addended by: Lesle Chris on: 09/22/2017 11:27 AM   Modules accepted: Orders

## 2017-09-22 NOTE — Telephone Encounter (Signed)
Pre-cert Verification for the following procedure   Left heart cath - 9/23 - 12:00 - Eldridge Dace

## 2017-09-22 NOTE — Progress Notes (Signed)
    SUBJECTIVE: The patient presents for follow-up of cardiomyopathy, chronic systolic heart failure, and left bundle branch block.  Nuclear stress test demonstrated an anterior/anterolateral defect exhibiting partial reversibility in the mid to apical level consistent with scar and mild to moderate peri-infarct ischemia.  There was a fixed inferior defect as well that may have represented soft tissue attenuation although scar could not entirely be excluded.  I previously reviewed an echocardiogram report dated 08/02/2017 which showed severely reduced left ventricular systolic function, LVEF 20 to 25%, grade 2 diastolic dysfunction, moderate mitral and mild to moderate aortic, pulmonic, and tricuspid regurgitation with moderate pulmonary hypertension, PASP 45-50 mmHg.  I increased Lasix to 40 mg at her last visit but she developed some hyponatremia and acute renal insufficiency so I reduced Lasix to 20 mg.  Sodium was 129 and creatinine was 1.11 on 08/16/2017.  Since her last visit with me she was evaluated in the ED for constipation and rectal bleeding.  Basic metabolic panel on 09/08/2017 showed creatinine 0.99 and sodium 132.  Weight is down 5 pounds since 08/10/2017.  She is on Toprol-XL 25 mg twice daily and Entresto 24-26 mg twice daily.  She is here with her son, Tony.  The patient denies chest pain but feels markedly fatigued and has exertional dyspnea and diaphoresis.  She feels cold at times.  She feels washed out after taking MiraLAX.  I reviewed her blood pressure log which showed blood pressures which have gotten as low as 71/42 and at least a few readings in the 90/50 range.  She describes a "sinking feeling "between 7 and 10 AM where she feels short of breath.    Review of Systems: As per "subjective", otherwise negative.  Allergies  Allergen Reactions  . Asa [Aspirin]     Epigastric discomfort; hives   . Ciprofloxacin     Headache   . Hctz [Hydrochlorothiazide]    Possible high calcium   . Lescol Xl [Fluvastatin]     Reflux   . Sulfa Antibiotics     abd upset     Current Outpatient Medications  Medication Sig Dispense Refill  . aspirin EC 81 MG tablet Take 1 tablet (81 mg total) by mouth daily. (Patient taking differently: Take 81 mg by mouth at bedtime. )    . atorvastatin (LIPITOR) 20 MG tablet Take 20 mg by mouth daily.    . cholecalciferol (VITAMIN D) 1000 units tablet Take 2,000 Units by mouth daily.    . fluticasone (FLONASE) 50 MCG/ACT nasal spray Place 1 spray into both nostrils daily.    . furosemide (LASIX) 40 MG tablet Take 0.5 tablets (20 mg total) by mouth daily.    . levothyroxine (SYNTHROID, LEVOTHROID) 88 MCG tablet Take 88 mcg by mouth daily before breakfast.    . metoprolol succinate (TOPROL-XL) 25 MG 24 hr tablet Take 25 mg by mouth 2 (two) times daily.    . pantoprazole (PROTONIX) 40 MG tablet Take 40 mg by mouth daily.    . Polyethylene Glycol 3350 (MIRALAX PO) Take by mouth.    . potassium chloride (K-DUR) 10 MEQ tablet Take 5 mEq by mouth daily.    . sacubitril-valsartan (ENTRESTO) 24-26 MG Take 1 tablet by mouth 2 (two) times daily. 180 tablet 3  . traMADol (ULTRAM) 50 MG tablet Take by mouth every 6 (six) hours as needed.     No current facility-administered medications for this visit.     Past Medical History:  Diagnosis Date  .   Actinic keratosis   . Degenerative joint disease   . Gout   . Hearing loss   . Hearing loss   . High cholesterol   . Hypercalcemia   . Hypertension   . Keratosis, seborrheic   . LBBB (left bundle branch block)   . Premature beats   . Primary ovarian failure   . Renal insufficiency syndrome   . Shoulder arthritis   . Tachycardia   . Vitamin D deficiency     Past Surgical History:  Procedure Laterality Date  . APPENDECTOMY    . COLONOSCOPY    . macrocytosis      Social History   Socioeconomic History  . Marital status: Married    Spouse name: Not on file  . Number of  children: Not on file  . Years of education: Not on file  . Highest education level: Not on file  Occupational History  . Not on file  Social Needs  . Financial resource strain: Not on file  . Food insecurity:    Worry: Not on file    Inability: Not on file  . Transportation needs:    Medical: Not on file    Non-medical: Not on file  Tobacco Use  . Smoking status: Never Smoker  . Smokeless tobacco: Never Used  Substance and Sexual Activity  . Alcohol use: Not on file  . Drug use: Not on file  . Sexual activity: Not on file  Lifestyle  . Physical activity:    Days per week: Not on file    Minutes per session: Not on file  . Stress: Not on file  Relationships  . Social connections:    Talks on phone: Not on file    Gets together: Not on file    Attends religious service: Not on file    Active member of club or organization: Not on file    Attends meetings of clubs or organizations: Not on file    Relationship status: Not on file  . Intimate partner violence:    Fear of current or ex partner: Not on file    Emotionally abused: Not on file    Physically abused: Not on file    Forced sexual activity: Not on file  Other Topics Concern  . Not on file  Social History Narrative  . Not on file     Vitals:   09/22/17 1000  BP: 112/62  Pulse: 66  SpO2: 98%  Weight: 153 lb (69.4 kg)  Height: 5' 3" (1.6 m)    Wt Readings from Last 3 Encounters:  09/22/17 153 lb (69.4 kg)  08/26/17 150 lb (68 kg)  08/10/17 158 lb (71.7 kg)     PHYSICAL EXAM General: NAD HEENT: Normal. Neck: No JVD, no thyromegaly. Lungs: Clear to auscultation bilaterally with normal respiratory effort. CV: Regular rate and rhythm, normal S1/paradoxically split S2, no S3/S4, no murmur. No pretibial or periankle edema.  Abdomen: Soft, nontender, no distention.  Neurologic: Alert and oriented.  Psych: Normal affect. Skin: Normal. Musculoskeletal: No gross deformities.    ECG: Reviewed above  under Subjective   Labs: Lab Results  Component Value Date/Time   K 4.3 09/08/2017 08:36 AM   BUN 14 09/08/2017 08:36 AM   BUN 13 08/16/2017 08:53 AM   CREATININE 0.99 (H) 09/08/2017 08:36 AM   HGB 15.0 08/26/2017 07:35 AM     Lipids: No results found for: LDLCALC, LDLDIRECT, CHOL, TRIG, HDL     ASSESSMENT AND PLAN: 1.    Cardiomyopathy/chronic systolic heart failure, LVEF 20-25%: Nuclear stress test reviewed above suggestive of an ischemic etiology for cardiomyopathy.  She has continued exertional dyspnea and has not noticed a change in symptoms with Entresto.  She has gotten hypotensive and symptomatic from this.  I will reduce Toprol-XL to 12.5 mg twice daily and I will discontinue Entresto altogether.  Continue Lasix 20 mg daily and increase potassium to 10 mEq daily. I will arrange for left heart catheterization and coronary angiography. If blood pressure increases over time, I may be able to reintroduce an ACE inhibitor or angiotensin receptor blocker at a very low dose. I will check an echocardiogram if medical therapy is able to be optimized for several months.  2.  Hypertension: Blood pressure is normal.  However, she has become markedly hypotensive on several occasions.  I will stop Entresto and reduce Toprol-XL to 12.5 mg twice daily.  3.  Hyperlipidemia: Continue Lipitor 20 mg daily.  4.  Left bundle branch block: Nuclear stress test reviewed above with suspicion for coronary artery disease.  5.  Coronary artery disease: Nuclear stress test reviewed above suggestive of ischemic heart disease.  She is on aspirin, beta-blocker, and atorvastatin.  I will arrange for left heart catheterization and coronary angiography. Risks and benefits of cardiac catheterization have been discussed with the patient.  These include bleeding, infection, kidney damage, stroke, heart attack, death.  The patient understands these risks and is willing to proceed.    Disposition: Follow up after  cath  A high level of decision making was required for increased medical complexities.    Suresh Koneswaran, M.D., F.A.C.C. 

## 2017-09-22 NOTE — Patient Instructions (Signed)
Medication Instructions:   Stop Entresto.   Decrease Toprol XL to 12.5mg  twice a day.  Increase Potassium to daily.   Continue all other medications.    Labwork: BMET, CBC - orders given today.   Testing/Procedures: Your physician has requested that you have a cardiac catheterization. Cardiac catheterization is used to diagnose and/or treat various heart conditions. Doctors may recommend this procedure for a number of different reasons. The most common reason is to evaluate chest pain. Chest pain can be a symptom of coronary artery disease (CAD), and cardiac catheterization can show whether plaque is narrowing or blocking your heart's arteries. This procedure is also used to evaluate the valves, as well as measure the blood flow and oxygen levels in different parts of your heart. For further information please visit [email or web address] Please follow instruction sheet, as given.  Follow-Up: 1 month   Any Other Special Instructions Will Be Listed Below (If Applicable).  If you need a refill on your cardiac medications before your next appointment, please call your pharmacy.

## 2017-09-23 ENCOUNTER — Telehealth: Payer: Self-pay | Admitting: *Deleted

## 2017-09-23 DIAGNOSIS — Z23 Encounter for immunization: Secondary | ICD-10-CM | POA: Diagnosis not present

## 2017-09-23 DIAGNOSIS — I519 Heart disease, unspecified: Secondary | ICD-10-CM | POA: Diagnosis not present

## 2017-09-23 DIAGNOSIS — I10 Essential (primary) hypertension: Secondary | ICD-10-CM | POA: Diagnosis not present

## 2017-09-23 DIAGNOSIS — R9439 Abnormal result of other cardiovascular function study: Secondary | ICD-10-CM | POA: Diagnosis not present

## 2017-09-23 DIAGNOSIS — I2510 Atherosclerotic heart disease of native coronary artery without angina pectoris: Secondary | ICD-10-CM | POA: Diagnosis not present

## 2017-09-23 DIAGNOSIS — Z01812 Encounter for preprocedural laboratory examination: Secondary | ICD-10-CM | POA: Diagnosis not present

## 2017-09-23 DIAGNOSIS — I447 Left bundle-branch block, unspecified: Secondary | ICD-10-CM | POA: Diagnosis not present

## 2017-09-23 DIAGNOSIS — Z299 Encounter for prophylactic measures, unspecified: Secondary | ICD-10-CM | POA: Diagnosis not present

## 2017-09-23 DIAGNOSIS — I255 Ischemic cardiomyopathy: Secondary | ICD-10-CM | POA: Diagnosis not present

## 2017-09-23 DIAGNOSIS — Z6828 Body mass index (BMI) 28.0-28.9, adult: Secondary | ICD-10-CM | POA: Diagnosis not present

## 2017-09-23 LAB — CBC
HCT: 36.7 % (ref 35.0–45.0)
HEMOGLOBIN: 12.7 g/dL (ref 11.7–15.5)
MCH: 33.2 pg — ABNORMAL HIGH (ref 27.0–33.0)
MCHC: 34.6 g/dL (ref 32.0–36.0)
MCV: 95.8 fL (ref 80.0–100.0)
MPV: 12.6 fL — ABNORMAL HIGH (ref 7.5–12.5)
Platelets: 153 10*3/uL (ref 140–400)
RBC: 3.83 10*6/uL (ref 3.80–5.10)
RDW: 13.6 % (ref 11.0–15.0)
WBC: 4.3 10*3/uL (ref 3.8–10.8)

## 2017-09-23 LAB — BASIC METABOLIC PANEL
BUN/Creatinine Ratio: 14 (calc) (ref 6–22)
BUN: 15 mg/dL (ref 7–25)
CALCIUM: 10.6 mg/dL — AB (ref 8.6–10.4)
CO2: 22 mmol/L (ref 20–32)
CREATININE: 1.08 mg/dL — AB (ref 0.60–0.88)
Chloride: 94 mmol/L — ABNORMAL LOW (ref 98–110)
Glucose, Bld: 111 mg/dL (ref 65–139)
POTASSIUM: 4.3 mmol/L (ref 3.5–5.3)
Sodium: 126 mmol/L — ABNORMAL LOW (ref 135–146)

## 2017-09-23 NOTE — Telephone Encounter (Signed)
Pt contacted pre-catheterization scheduled at [facility] for: Monday September 27, 2017 12 noon Verified arrival time and place: [facility] Main Entrance A at: 10 AM  No solid food after midnight prior to cath, clear liquids until 5 AM day of procedure. Verified no diabetes medications.  Hold: Furosemide-AM of procedure. KCl -AM of procedure.  Except hold medications AM meds can be  taken pre-cath with sip of water including: ASA 81 mg  Confirmed patient has responsible person to drive home post procedure and for 24 hours after you arrive home: yes

## 2017-09-24 ENCOUNTER — Telehealth: Payer: Self-pay | Admitting: Cardiovascular Disease

## 2017-09-24 MED ORDER — FUROSEMIDE 40 MG PO TABS: 40.0000 mg | ORAL_TABLET | Freq: Every day | ORAL | Status: DC

## 2017-09-24 NOTE — Telephone Encounter (Signed)
Patient and son informed and verbalized understanding.  Patient and son aware to hold lasix and potassium day of cath.

## 2017-09-24 NOTE — Telephone Encounter (Signed)
Patient's swelling has returned since doctor cut her lasix in half.  Would like to know if she should take a whole one

## 2017-09-24 NOTE — Telephone Encounter (Signed)
Take lasix 40mg  x 3 days, then change to 20mg  alternating days with 40mg    Dominga Ferry MD

## 2017-09-24 NOTE — Telephone Encounter (Signed)
Patient c/o swelling in her ankles and feet today. Patient said she can put her shoes on. Patient said she did sleep well last night. Patient said she is sob all the time but today her sob is better. No c/o swelling in hands, face or abdomen. No c/o chest pain or dizziness. Weight today is 161 lbs and yesterday at PCP office weight was 157 lbs. Patient said her normal home weight is around 151lbs. Patient says her weight has gradually increased in the past week. No extra sodium in diet. Medications reconciled.

## 2017-09-27 ENCOUNTER — Encounter (HOSPITAL_COMMUNITY)
Admission: RE | Disposition: A | Discharge status: Home Health | Payer: Self-pay | Source: Ambulatory Visit | Attending: Interventional Cardiology

## 2017-09-27 ENCOUNTER — Inpatient Hospital Stay (HOSPITAL_COMMUNITY)
Admission: RE | Admit: 2017-09-27 | Discharge: 2017-10-01 | DRG: 247 | Disposition: A | Discharge status: Home Health | Payer: Medicare Other | Source: Ambulatory Visit | Attending: Interventional Cardiology | Admitting: Interventional Cardiology

## 2017-09-27 DIAGNOSIS — Z7982 Long term (current) use of aspirin: Secondary | ICD-10-CM

## 2017-09-27 DIAGNOSIS — I471 Supraventricular tachycardia: Secondary | ICD-10-CM | POA: Diagnosis not present

## 2017-09-27 DIAGNOSIS — I5022 Chronic systolic (congestive) heart failure: Secondary | ICD-10-CM | POA: Diagnosis not present

## 2017-09-27 DIAGNOSIS — Z888 Allergy status to other drugs, medicaments and biological substances status: Secondary | ICD-10-CM | POA: Diagnosis not present

## 2017-09-27 DIAGNOSIS — I4891 Unspecified atrial fibrillation: Secondary | ICD-10-CM | POA: Diagnosis not present

## 2017-09-27 DIAGNOSIS — I97630 Postprocedural hematoma of a circulatory system organ or structure following a cardiac catheterization: Secondary | ICD-10-CM | POA: Diagnosis not present

## 2017-09-27 DIAGNOSIS — E785 Hyperlipidemia, unspecified: Secondary | ICD-10-CM | POA: Diagnosis present

## 2017-09-27 DIAGNOSIS — I447 Left bundle-branch block, unspecified: Secondary | ICD-10-CM | POA: Diagnosis not present

## 2017-09-27 DIAGNOSIS — R9439 Abnormal result of other cardiovascular function study: Secondary | ICD-10-CM

## 2017-09-27 DIAGNOSIS — E559 Vitamin D deficiency, unspecified: Secondary | ICD-10-CM | POA: Diagnosis present

## 2017-09-27 DIAGNOSIS — E7800 Pure hypercholesterolemia, unspecified: Secondary | ICD-10-CM | POA: Diagnosis present

## 2017-09-27 DIAGNOSIS — I110 Hypertensive heart disease with heart failure: Secondary | ICD-10-CM | POA: Diagnosis not present

## 2017-09-27 DIAGNOSIS — I959 Hypotension, unspecified: Secondary | ICD-10-CM | POA: Diagnosis present

## 2017-09-27 DIAGNOSIS — I429 Cardiomyopathy, unspecified: Secondary | ICD-10-CM | POA: Diagnosis not present

## 2017-09-27 DIAGNOSIS — Z7951 Long term (current) use of inhaled steroids: Secondary | ICD-10-CM

## 2017-09-27 DIAGNOSIS — I071 Rheumatic tricuspid insufficiency: Secondary | ICD-10-CM | POA: Diagnosis present

## 2017-09-27 DIAGNOSIS — Z955 Presence of coronary angioplasty implant and graft: Secondary | ICD-10-CM

## 2017-09-27 DIAGNOSIS — M19019 Primary osteoarthritis, unspecified shoulder: Secondary | ICD-10-CM | POA: Diagnosis present

## 2017-09-27 DIAGNOSIS — M109 Gout, unspecified: Secondary | ICD-10-CM | POA: Diagnosis present

## 2017-09-27 DIAGNOSIS — Z882 Allergy status to sulfonamides status: Secondary | ICD-10-CM

## 2017-09-27 DIAGNOSIS — Z881 Allergy status to other antibiotic agents status: Secondary | ICD-10-CM

## 2017-09-27 DIAGNOSIS — M1990 Unspecified osteoarthritis, unspecified site: Secondary | ICD-10-CM | POA: Diagnosis present

## 2017-09-27 DIAGNOSIS — N200 Calculus of kidney: Secondary | ICD-10-CM | POA: Diagnosis present

## 2017-09-27 DIAGNOSIS — Z886 Allergy status to analgesic agent status: Secondary | ICD-10-CM | POA: Diagnosis not present

## 2017-09-27 DIAGNOSIS — H9190 Unspecified hearing loss, unspecified ear: Secondary | ICD-10-CM | POA: Diagnosis present

## 2017-09-27 DIAGNOSIS — I2510 Atherosclerotic heart disease of native coronary artery without angina pectoris: Principal | ICD-10-CM | POA: Diagnosis present

## 2017-09-27 DIAGNOSIS — K8020 Calculus of gallbladder without cholecystitis without obstruction: Secondary | ICD-10-CM | POA: Diagnosis present

## 2017-09-27 DIAGNOSIS — I272 Pulmonary hypertension, unspecified: Secondary | ICD-10-CM | POA: Diagnosis present

## 2017-09-27 DIAGNOSIS — Z79890 Hormone replacement therapy (postmenopausal): Secondary | ICD-10-CM

## 2017-09-27 HISTORY — DX: Other specified diseases of blood and blood-forming organs: D75.89

## 2017-09-27 HISTORY — DX: Other chronic pain: G89.29

## 2017-09-27 HISTORY — DX: Dorsalgia, unspecified: M54.9

## 2017-09-27 HISTORY — DX: Acute myocardial infarction, unspecified: I21.9

## 2017-09-27 HISTORY — DX: Atherosclerotic heart disease of native coronary artery without angina pectoris: I25.10

## 2017-09-27 HISTORY — DX: Unspecified osteoarthritis, unspecified site: M19.90

## 2017-09-27 HISTORY — DX: Hypothyroidism, unspecified: E03.9

## 2017-09-27 HISTORY — PX: CARDIAC CATHETERIZATION: SHX172

## 2017-09-27 HISTORY — PX: RIGHT/LEFT HEART CATH AND CORONARY ANGIOGRAPHY: CATH118266

## 2017-09-27 LAB — POCT I-STAT 3, VENOUS BLOOD GAS (G3P V)
ACID-BASE DEFICIT: 3 mmol/L — AB (ref 0.0–2.0)
ACID-BASE DEFICIT: 3 mmol/L — AB (ref 0.0–2.0)
BICARBONATE: 21.6 mmol/L (ref 20.0–28.0)
BICARBONATE: 22 mmol/L (ref 20.0–28.0)
O2 SAT: 72 %
O2 Saturation: 95 %
PCO2 VEN: 37.1 mmHg — AB (ref 44.0–60.0)
PO2 VEN: 38 mmHg (ref 32.0–45.0)
PO2 VEN: 77 mmHg — AB (ref 32.0–45.0)
TCO2: 23 mmol/L (ref 22–32)
TCO2: 23 mmol/L (ref 22–32)
pCO2, Ven: 36.9 mmHg — ABNORMAL LOW (ref 44.0–60.0)
pH, Ven: 7.376 (ref 7.250–7.430)
pH, Ven: 7.381 (ref 7.250–7.430)

## 2017-09-27 LAB — POCT ACTIVATED CLOTTING TIME: Activated Clotting Time: 417 seconds

## 2017-09-27 SURGERY — RIGHT/LEFT HEART CATH AND CORONARY ANGIOGRAPHY
Anesthesia: LOCAL

## 2017-09-27 MED ORDER — VERAPAMIL HCL 2.5 MG/ML IV SOLN
INTRAVENOUS | Status: AC
  Filled 2017-09-27: qty 2

## 2017-09-27 MED ORDER — HEPARIN SODIUM (PORCINE) 1000 UNIT/ML IJ SOLN
INTRAMUSCULAR | Status: AC
  Filled 2017-09-27: qty 1

## 2017-09-27 MED ORDER — SODIUM CHLORIDE 0.9 % IV SOLN
INTRAVENOUS | Status: DC | PRN
  Administered 2017-09-27: 1.75 mg/kg/h via INTRAVENOUS

## 2017-09-27 MED ORDER — TRAMADOL HCL 50 MG PO TABS
50.0000 mg | ORAL_TABLET | Freq: Four times a day (QID) | ORAL | Status: DC | PRN
  Administered 2017-09-27 – 2017-10-01 (×8): 50 mg via ORAL
  Filled 2017-09-27 (×8): qty 1

## 2017-09-27 MED ORDER — VERAPAMIL HCL 2.5 MG/ML IV SOLN
INTRAVENOUS | Status: DC | PRN
  Administered 2017-09-27: 10 mL via INTRA_ARTERIAL

## 2017-09-27 MED ORDER — FENTANYL CITRATE (PF) 100 MCG/2ML IJ SOLN
INTRAMUSCULAR | Status: AC
  Filled 2017-09-27: qty 2

## 2017-09-27 MED ORDER — ATORVASTATIN CALCIUM 20 MG PO TABS
20.0000 mg | ORAL_TABLET | Freq: Every day | ORAL | Status: DC
  Administered 2017-09-27: 16:00:00 20 mg via ORAL
  Filled 2017-09-27: qty 1

## 2017-09-27 MED ORDER — SODIUM CHLORIDE 0.9 % IV SOLN: 250.0000 mL | INTRAVENOUS | Status: DC | PRN

## 2017-09-27 MED ORDER — SODIUM CHLORIDE 0.9 % IV SOLN
INTRAVENOUS | Status: DC
  Administered 2017-09-27: 12:00:00 via INTRAVENOUS

## 2017-09-27 MED ORDER — ACETAMINOPHEN 325 MG PO TABS
650.0000 mg | ORAL_TABLET | ORAL | Status: DC | PRN
  Administered 2017-09-27 – 2017-09-28 (×2): 650 mg via ORAL
  Filled 2017-09-27 (×2): qty 2

## 2017-09-27 MED ORDER — PANTOPRAZOLE SODIUM 40 MG PO TBEC
40.0000 mg | DELAYED_RELEASE_TABLET | Freq: Every day | ORAL | Status: DC
  Administered 2017-09-27 – 2017-10-01 (×5): 40 mg via ORAL
  Filled 2017-09-27 (×5): qty 1

## 2017-09-27 MED ORDER — MIDAZOLAM HCL 2 MG/2ML IJ SOLN
INTRAMUSCULAR | Status: AC
  Filled 2017-09-27: qty 2

## 2017-09-27 MED ORDER — FENTANYL CITRATE (PF) 100 MCG/2ML IJ SOLN
INTRAMUSCULAR | Status: DC | PRN
  Administered 2017-09-27 (×3): 12.5 ug via INTRAVENOUS

## 2017-09-27 MED ORDER — BIVALIRUDIN TRIFLUOROACETATE 250 MG IV SOLR
INTRAVENOUS | Status: AC
  Filled 2017-09-27: qty 250

## 2017-09-27 MED ORDER — SODIUM CHLORIDE 0.9% FLUSH: 3.0000 mL | INTRAVENOUS | Status: DC | PRN

## 2017-09-27 MED ORDER — SODIUM CHLORIDE 0.9 % IV SOLN
INTRAVENOUS | Status: AC
  Administered 2017-09-27: 16:00:00 via INTRAVENOUS

## 2017-09-27 MED ORDER — SODIUM CHLORIDE 0.9% FLUSH
3.0000 mL | Freq: Two times a day (BID) | INTRAVENOUS | Status: DC
  Administered 2017-09-27 – 2017-09-28 (×2): 3 mL via INTRAVENOUS

## 2017-09-27 MED ORDER — FLUTICASONE PROPIONATE 50 MCG/ACT NA SUSP
1.0000 | Freq: Every day | NASAL | Status: DC
  Administered 2017-09-27 – 2017-10-01 (×5): 1 via NASAL
  Filled 2017-09-27 (×3): qty 16

## 2017-09-27 MED ORDER — BIVALIRUDIN BOLUS VIA INFUSION - CUPID
INTRAVENOUS | Status: DC | PRN
  Administered 2017-09-27: 51.375 mg via INTRAVENOUS

## 2017-09-27 MED ORDER — ONDANSETRON HCL 4 MG/2ML IJ SOLN
4.0000 mg | Freq: Four times a day (QID) | INTRAMUSCULAR | Status: DC | PRN
  Administered 2017-09-28: 4 mg via INTRAVENOUS
  Filled 2017-09-27: qty 2

## 2017-09-27 MED ORDER — IOHEXOL 350 MG/ML SOLN
INTRAVENOUS | Status: DC | PRN
  Administered 2017-09-27: 95 mL via INTRA_ARTERIAL

## 2017-09-27 MED ORDER — POLYETHYLENE GLYCOL 3350 17 GM/SCOOP PO POWD
1.0000 | Freq: Every day | ORAL | Status: DC | PRN
  Administered 2017-09-29: 238 g via ORAL
  Filled 2017-09-27 (×2): qty 255

## 2017-09-27 MED ORDER — SODIUM CHLORIDE 0.9% FLUSH: 3.0000 mL | Freq: Two times a day (BID) | INTRAVENOUS | Status: DC

## 2017-09-27 MED ORDER — LIDOCAINE HCL (PF) 1 % IJ SOLN
INTRAMUSCULAR | Status: AC
  Filled 2017-09-27: qty 30

## 2017-09-27 MED ORDER — HEPARIN (PORCINE) IN NACL 1000-0.9 UT/500ML-% IV SOLN
INTRAVENOUS | Status: DC | PRN
  Administered 2017-09-27 (×3): 500 mL

## 2017-09-27 MED ORDER — METOPROLOL SUCCINATE ER 25 MG PO TB24
12.5000 mg | ORAL_TABLET | Freq: Two times a day (BID) | ORAL | Status: DC
  Administered 2017-09-27 – 2017-09-29 (×3): 12.5 mg via ORAL
  Filled 2017-09-27 (×5): qty 1

## 2017-09-27 MED ORDER — LEVOTHYROXINE SODIUM 88 MCG PO TABS
88.0000 ug | ORAL_TABLET | Freq: Every day | ORAL | Status: DC
  Administered 2017-09-28 – 2017-10-01 (×4): 88 ug via ORAL
  Filled 2017-09-27 (×4): qty 1

## 2017-09-27 MED ORDER — HEPARIN SODIUM (PORCINE) 1000 UNIT/ML IJ SOLN
INTRAMUSCULAR | Status: DC | PRN
  Administered 2017-09-27: 3500 [IU] via INTRAVENOUS

## 2017-09-27 MED ORDER — LIDOCAINE HCL (PF) 1 % IJ SOLN
INTRAMUSCULAR | Status: DC | PRN
  Administered 2017-09-27 (×2): 2 mL

## 2017-09-27 MED ORDER — ASPIRIN EC 81 MG PO TBEC
81.0000 mg | DELAYED_RELEASE_TABLET | Freq: Every day | ORAL | Status: DC
  Administered 2017-09-27 – 2017-10-01 (×5): 81 mg via ORAL
  Filled 2017-09-27 (×5): qty 1

## 2017-09-27 MED ORDER — MIDAZOLAM HCL 2 MG/2ML IJ SOLN
INTRAMUSCULAR | Status: DC | PRN
  Administered 2017-09-27 (×2): 0.5 mg via INTRAVENOUS

## 2017-09-27 MED ORDER — HEPARIN (PORCINE) IN NACL 1000-0.9 UT/500ML-% IV SOLN
INTRAVENOUS | Status: AC
  Filled 2017-09-27: qty 1500

## 2017-09-27 SURGICAL SUPPLY — 17 items
CATH 5FR JL3.5 JR4 ANG PIG MP (CATHETERS) ×2 IMPLANT
CATH BALLN WEDGE 5F 110CM (CATHETERS) ×2 IMPLANT
CATH LAUNCHER 6FR EBU 3 (CATHETERS) ×2 IMPLANT
DEVICE RAD COMP TR BAND LRG (VASCULAR PRODUCTS) ×2 IMPLANT
GLIDESHEATH SLEND SS 6F .021 (SHEATH) ×2 IMPLANT
GUIDEWIRE .025 260CM (WIRE) ×2 IMPLANT
GUIDEWIRE INQWIRE 1.5J.035X260 (WIRE) ×1 IMPLANT
INQWIRE 1.5J .035X260CM (WIRE) ×2
KIT ENCORE 26 ADVANTAGE (KITS) ×2 IMPLANT
KIT HEART LEFT (KITS) ×2 IMPLANT
KIT HEMO VALVE WATCHDOG (MISCELLANEOUS) ×2 IMPLANT
PACK CARDIAC CATHETERIZATION (CUSTOM PROCEDURE TRAY) ×2 IMPLANT
SHEATH GLIDE SLENDER 4/5FR (SHEATH) ×2 IMPLANT
SHEATH PROBE COVER 6X72 (BAG) ×2 IMPLANT
TRANSDUCER W/STOPCOCK (MISCELLANEOUS) ×2 IMPLANT
TUBING CIL FLEX 10 FLL-RA (TUBING) ×2 IMPLANT
WIRE FIGHTER CROSSING 190CM (WIRE) ×2 IMPLANT

## 2017-09-27 NOTE — Interval H&P Note (Signed)
Cath Lab Visit (complete for each Cath Lab visit)  Clinical Evaluation Leading to the Procedure:   ACS: No.  Non-ACS:    Anginal Classification: CCS III  Anti-ischemic medical therapy: Minimal Therapy (1 class of medications)  Non-Invasive Test Results: Intermediate-risk stress test findings: cardiac mortality 1-3%/year  Prior CABG: No previous CABG      History and Physical Interval Note:  09/27/2017 12:31 PM  Danielle Bell  has presented today for surgery, with the diagnosis of as - left bundle branch block  The various methods of treatment have been discussed with the patient and family. After consideration of risks, benefits and other options for treatment, the patient has consented to  Procedure(s): LEFT HEART CATH AND CORONARY ANGIOGRAPHY (N/A) as a surgical intervention .  The patient's history has been reviewed, patient examined, no change in status, stable for surgery.  I have reviewed the patient's chart and labs.  Questions were answered to the patient's satisfaction.     Lance Muss

## 2017-09-27 NOTE — Progress Notes (Signed)
Pt arrived from [facility] with level 2 right radial. Hematoma halfway up forearm and had IV infusing in forearm as well. Called cath lab to have nurse to eval. Haywood Pao RN arrived to room, removed IV, and held manual pressure to right arm expressing hematoma. No blood expressed out, but hematoma softened and area was marked. Kenzie RN Repositioned radial band and inflated with 10cc air. Cont to monitor. Emelda Brothers RN

## 2017-09-28 ENCOUNTER — Encounter (HOSPITAL_COMMUNITY)
Admission: RE | Disposition: A | Discharge status: Home Health | Payer: Self-pay | Source: Ambulatory Visit | Attending: Interventional Cardiology

## 2017-09-28 ENCOUNTER — Other Ambulatory Visit: Payer: Self-pay

## 2017-09-28 ENCOUNTER — Observation Stay (HOSPITAL_BASED_OUTPATIENT_CLINIC_OR_DEPARTMENT_OTHER): Payer: Medicare Other

## 2017-09-28 ENCOUNTER — Encounter (HOSPITAL_COMMUNITY): Payer: Self-pay | Admitting: Interventional Cardiology

## 2017-09-28 DIAGNOSIS — Z886 Allergy status to analgesic agent status: Secondary | ICD-10-CM | POA: Diagnosis not present

## 2017-09-28 DIAGNOSIS — I5030 Unspecified diastolic (congestive) heart failure: Secondary | ICD-10-CM

## 2017-09-28 DIAGNOSIS — I5022 Chronic systolic (congestive) heart failure: Secondary | ICD-10-CM | POA: Diagnosis present

## 2017-09-28 DIAGNOSIS — I110 Hypertensive heart disease with heart failure: Secondary | ICD-10-CM | POA: Diagnosis present

## 2017-09-28 DIAGNOSIS — Z882 Allergy status to sulfonamides status: Secondary | ICD-10-CM | POA: Diagnosis not present

## 2017-09-28 DIAGNOSIS — Z7982 Long term (current) use of aspirin: Secondary | ICD-10-CM | POA: Diagnosis not present

## 2017-09-28 DIAGNOSIS — Z881 Allergy status to other antibiotic agents status: Secondary | ICD-10-CM | POA: Diagnosis not present

## 2017-09-28 DIAGNOSIS — E785 Hyperlipidemia, unspecified: Secondary | ICD-10-CM

## 2017-09-28 DIAGNOSIS — M19019 Primary osteoarthritis, unspecified shoulder: Secondary | ICD-10-CM | POA: Diagnosis present

## 2017-09-28 DIAGNOSIS — I447 Left bundle-branch block, unspecified: Secondary | ICD-10-CM | POA: Diagnosis present

## 2017-09-28 DIAGNOSIS — Z7951 Long term (current) use of inhaled steroids: Secondary | ICD-10-CM | POA: Diagnosis not present

## 2017-09-28 DIAGNOSIS — I071 Rheumatic tricuspid insufficiency: Secondary | ICD-10-CM | POA: Diagnosis present

## 2017-09-28 DIAGNOSIS — I429 Cardiomyopathy, unspecified: Secondary | ICD-10-CM | POA: Diagnosis present

## 2017-09-28 DIAGNOSIS — I471 Supraventricular tachycardia: Secondary | ICD-10-CM | POA: Diagnosis present

## 2017-09-28 DIAGNOSIS — Z79890 Hormone replacement therapy (postmenopausal): Secondary | ICD-10-CM | POA: Diagnosis not present

## 2017-09-28 DIAGNOSIS — D649 Anemia, unspecified: Secondary | ICD-10-CM | POA: Diagnosis not present

## 2017-09-28 DIAGNOSIS — H9190 Unspecified hearing loss, unspecified ear: Secondary | ICD-10-CM | POA: Diagnosis present

## 2017-09-28 DIAGNOSIS — I272 Pulmonary hypertension, unspecified: Secondary | ICD-10-CM | POA: Diagnosis present

## 2017-09-28 DIAGNOSIS — I2510 Atherosclerotic heart disease of native coronary artery without angina pectoris: Secondary | ICD-10-CM | POA: Diagnosis present

## 2017-09-28 DIAGNOSIS — E559 Vitamin D deficiency, unspecified: Secondary | ICD-10-CM | POA: Diagnosis present

## 2017-09-28 DIAGNOSIS — E7800 Pure hypercholesterolemia, unspecified: Secondary | ICD-10-CM | POA: Diagnosis present

## 2017-09-28 DIAGNOSIS — R9439 Abnormal result of other cardiovascular function study: Secondary | ICD-10-CM

## 2017-09-28 DIAGNOSIS — M1990 Unspecified osteoarthritis, unspecified site: Secondary | ICD-10-CM | POA: Diagnosis present

## 2017-09-28 DIAGNOSIS — K8020 Calculus of gallbladder without cholecystitis without obstruction: Secondary | ICD-10-CM | POA: Diagnosis not present

## 2017-09-28 DIAGNOSIS — I97630 Postprocedural hematoma of a circulatory system organ or structure following a cardiac catheterization: Secondary | ICD-10-CM | POA: Diagnosis not present

## 2017-09-28 DIAGNOSIS — I959 Hypotension, unspecified: Secondary | ICD-10-CM | POA: Diagnosis present

## 2017-09-28 DIAGNOSIS — M109 Gout, unspecified: Secondary | ICD-10-CM | POA: Diagnosis present

## 2017-09-28 DIAGNOSIS — Z888 Allergy status to other drugs, medicaments and biological substances status: Secondary | ICD-10-CM | POA: Diagnosis not present

## 2017-09-28 HISTORY — PX: CORONARY ANGIOPLASTY WITH STENT PLACEMENT: SHX49

## 2017-09-28 HISTORY — PX: CORONARY STENT INTERVENTION: CATH118234

## 2017-09-28 LAB — BASIC METABOLIC PANEL
ANION GAP: 14 (ref 5–15)
BUN: 11 mg/dL (ref 8–23)
CALCIUM: 10.6 mg/dL — AB (ref 8.9–10.3)
CO2: 21 mmol/L — AB (ref 22–32)
Chloride: 96 mmol/L — ABNORMAL LOW (ref 98–111)
Creatinine, Ser: 1.08 mg/dL — ABNORMAL HIGH (ref 0.44–1.00)
GFR calc non Af Amer: 45 mL/min — ABNORMAL LOW (ref >60)
GFR, EST AFRICAN AMERICAN: 52 mL/min — AB (ref >60)
Glucose, Bld: 109 mg/dL — ABNORMAL HIGH (ref 70–99)
Potassium: 3.6 mmol/L (ref 3.5–5.1)
Sodium: 131 mmol/L — ABNORMAL LOW (ref 135–145)

## 2017-09-28 LAB — CBC
HEMATOCRIT: 36.1 % (ref 36.0–46.0)
Hemoglobin: 12.2 g/dL (ref 12.0–15.0)
MCH: 33 pg (ref 26.0–34.0)
MCHC: 33.8 g/dL (ref 30.0–36.0)
MCV: 97.6 fL (ref 78.0–100.0)
PLATELETS: 133 10*3/uL — AB (ref 150–400)
RBC: 3.7 MIL/uL — ABNORMAL LOW (ref 3.87–5.11)
RDW: 14.9 % (ref 11.5–15.5)
WBC: 4 10*3/uL (ref 4.0–10.5)

## 2017-09-28 LAB — POCT ACTIVATED CLOTTING TIME
ACTIVATED CLOTTING TIME: 202 s
ACTIVATED CLOTTING TIME: 175 s
Activated Clotting Time: 279 seconds
Activated Clotting Time: 285 seconds
Activated Clotting Time: 340 seconds
Activated Clotting Time: 384 seconds

## 2017-09-28 LAB — ECHOCARDIOGRAM COMPLETE
Height: 63 in
Weight: 2409.6 oz

## 2017-09-28 SURGERY — CORONARY STENT INTERVENTION
Anesthesia: LOCAL

## 2017-09-28 MED ORDER — HEPARIN (PORCINE) IN NACL 1000-0.9 UT/500ML-% IV SOLN
INTRAVENOUS | Status: DC | PRN
  Administered 2017-09-28 (×2): 500 mL

## 2017-09-28 MED ORDER — SODIUM CHLORIDE 0.9 % IV SOLN: INTRAVENOUS | Status: DC

## 2017-09-28 MED ORDER — FENTANYL CITRATE (PF) 100 MCG/2ML IJ SOLN
INTRAMUSCULAR | Status: DC | PRN
  Administered 2017-09-28 (×2): 12.5 ug via INTRAVENOUS

## 2017-09-28 MED ORDER — ATORVASTATIN CALCIUM 40 MG PO TABS
40.0000 mg | ORAL_TABLET | Freq: Every day | ORAL | Status: DC
  Administered 2017-09-28 – 2017-10-01 (×4): 40 mg via ORAL
  Filled 2017-09-28 (×4): qty 1

## 2017-09-28 MED ORDER — CLOPIDOGREL BISULFATE 300 MG PO TABS
ORAL_TABLET | ORAL | Status: DC | PRN
  Administered 2017-09-28: 600 mg via ORAL

## 2017-09-28 MED ORDER — NITROGLYCERIN 1 MG/10 ML FOR IR/CATH LAB
INTRA_ARTERIAL | Status: DC | PRN
  Administered 2017-09-28 (×4): 200 ug via INTRACORONARY

## 2017-09-28 MED ORDER — HEPARIN SODIUM (PORCINE) 1000 UNIT/ML IJ SOLN
INTRAMUSCULAR | Status: AC
  Filled 2017-09-28: qty 1

## 2017-09-28 MED ORDER — LIDOCAINE HCL (PF) 1 % IJ SOLN
INTRAMUSCULAR | Status: DC | PRN
  Administered 2017-09-28: 16 mL

## 2017-09-28 MED ORDER — ACETAMINOPHEN 325 MG PO TABS: 650.0000 mg | ORAL_TABLET | ORAL | Status: DC | PRN

## 2017-09-28 MED ORDER — HYDROCODONE-ACETAMINOPHEN 5-325 MG PO TABS
1.0000 | ORAL_TABLET | Freq: Four times a day (QID) | ORAL | Status: DC | PRN
  Administered 2017-09-28: 19:00:00 1 via ORAL
  Filled 2017-09-28: qty 1

## 2017-09-28 MED ORDER — SODIUM CHLORIDE 0.9% FLUSH: 3.0000 mL | INTRAVENOUS | Status: DC | PRN

## 2017-09-28 MED ORDER — LABETALOL HCL 5 MG/ML IV SOLN: 10.0000 mg | INTRAVENOUS | Status: AC | PRN

## 2017-09-28 MED ORDER — NITROGLYCERIN 1 MG/10 ML FOR IR/CATH LAB
INTRA_ARTERIAL | Status: AC
  Filled 2017-09-28: qty 10

## 2017-09-28 MED ORDER — SODIUM CHLORIDE 0.9 % IV SOLN: 250.0000 mL | INTRAVENOUS | Status: DC | PRN

## 2017-09-28 MED ORDER — HYDRALAZINE HCL 20 MG/ML IJ SOLN: 5.0000 mg | INTRAMUSCULAR | Status: AC | PRN

## 2017-09-28 MED ORDER — SODIUM CHLORIDE 0.9 % IV SOLN
INTRAVENOUS | Status: AC
  Administered 2017-09-28: 16:00:00 via INTRAVENOUS

## 2017-09-28 MED ORDER — ANGIOPLASTY BOOK
Freq: Once | Status: AC
  Administered 2017-09-28: 22:00:00 1
  Filled 2017-09-28: qty 1

## 2017-09-28 MED ORDER — IOHEXOL 350 MG/ML SOLN
INTRAVENOUS | Status: DC | PRN
  Administered 2017-09-28: 130 mL

## 2017-09-28 MED ORDER — CLOPIDOGREL BISULFATE 75 MG PO TABS
75.0000 mg | ORAL_TABLET | Freq: Every day | ORAL | Status: DC
  Administered 2017-09-29 – 2017-10-01 (×3): 75 mg via ORAL
  Filled 2017-09-28 (×3): qty 1

## 2017-09-28 MED ORDER — LOSARTAN POTASSIUM 25 MG PO TABS
12.5000 mg | ORAL_TABLET | Freq: Every day | ORAL | Status: DC
  Administered 2017-09-28: 12.5 mg via ORAL
  Filled 2017-09-28: qty 1
  Filled 2017-09-28: qty 0.5

## 2017-09-28 MED ORDER — FAMOTIDINE IN NACL 20-0.9 MG/50ML-% IV SOLN
INTRAVENOUS | Status: AC | PRN
  Administered 2017-09-28: 20 mg via INTRAVENOUS

## 2017-09-28 MED ORDER — ASPIRIN 81 MG PO CHEW: 81.0000 mg | CHEWABLE_TABLET | Freq: Every day | ORAL | Status: DC

## 2017-09-28 MED ORDER — LIDOCAINE HCL (PF) 1 % IJ SOLN
INTRAMUSCULAR | Status: AC
  Filled 2017-09-28: qty 30

## 2017-09-28 MED ORDER — HEPARIN (PORCINE) IN NACL 1000-0.9 UT/500ML-% IV SOLN
INTRAVENOUS | Status: AC
  Filled 2017-09-28: qty 1000

## 2017-09-28 MED ORDER — SODIUM CHLORIDE 0.9% FLUSH: 3.0000 mL | Freq: Two times a day (BID) | INTRAVENOUS | Status: DC

## 2017-09-28 MED ORDER — FAMOTIDINE IN NACL 20-0.9 MG/50ML-% IV SOLN
INTRAVENOUS | Status: AC
  Filled 2017-09-28: qty 50

## 2017-09-28 MED ORDER — CLOPIDOGREL BISULFATE 300 MG PO TABS
ORAL_TABLET | ORAL | Status: AC
  Filled 2017-09-28: qty 2

## 2017-09-28 MED ORDER — SODIUM CHLORIDE 0.9 % IV BOLUS: 250.0000 mL | Freq: Once | INTRAVENOUS | Status: DC

## 2017-09-28 MED ORDER — SODIUM CHLORIDE 0.9% FLUSH
3.0000 mL | Freq: Two times a day (BID) | INTRAVENOUS | Status: DC
  Administered 2017-09-29 – 2017-09-30 (×2): 3 mL via INTRAVENOUS

## 2017-09-28 MED ORDER — FENTANYL CITRATE (PF) 100 MCG/2ML IJ SOLN
INTRAMUSCULAR | Status: AC
  Filled 2017-09-28: qty 2

## 2017-09-28 MED ORDER — HEPARIN SODIUM (PORCINE) 1000 UNIT/ML IJ SOLN
INTRAMUSCULAR | Status: DC | PRN
  Administered 2017-09-28: 3000 [IU] via INTRAVENOUS
  Administered 2017-09-28: 4000 [IU] via INTRAVENOUS
  Administered 2017-09-28: 7000 [IU] via INTRAVENOUS
  Administered 2017-09-28: 3000 [IU] via INTRAVENOUS

## 2017-09-28 MED ORDER — MIDAZOLAM HCL 2 MG/2ML IJ SOLN
INTRAMUSCULAR | Status: AC
  Filled 2017-09-28: qty 2

## 2017-09-28 MED ORDER — SODIUM CHLORIDE 0.9% FLUSH
3.0000 mL | INTRAVENOUS | Status: DC | PRN
  Administered 2017-09-29: 11:00:00 3 mL via INTRAVENOUS
  Filled 2017-09-28: qty 3

## 2017-09-28 MED ORDER — ONDANSETRON HCL 4 MG/2ML IJ SOLN: 4.0000 mg | Freq: Four times a day (QID) | INTRAMUSCULAR | Status: DC | PRN

## 2017-09-28 MED ORDER — MIDAZOLAM HCL 2 MG/2ML IJ SOLN
INTRAMUSCULAR | Status: DC | PRN
  Administered 2017-09-28 (×2): 0.5 mg via INTRAVENOUS

## 2017-09-28 MED ORDER — HEPARIN SODIUM (PORCINE) 5000 UNIT/ML IJ SOLN
5000.0000 [IU] | Freq: Three times a day (TID) | INTRAMUSCULAR | Status: DC
  Administered 2017-09-29 – 2017-09-30 (×4): 5000 [IU] via SUBCUTANEOUS
  Filled 2017-09-28 (×4): qty 1

## 2017-09-28 SURGICAL SUPPLY — 20 items
BALLN SAPPHIRE 2.0X12 (BALLOONS) ×2
BALLN SAPPHIRE 2.25X15 (BALLOONS) ×2
BALLN SAPPHIRE ~~LOC~~ 2.75X12 (BALLOONS) ×2 IMPLANT
BALLOON SAPPHIRE 2.0X12 (BALLOONS) ×1 IMPLANT
BALLOON SAPPHIRE 2.25X15 (BALLOONS) ×1 IMPLANT
CATH LAUNCHER 6FR EBU3.5 (CATHETERS) ×2 IMPLANT
CATH VENTURE RX (CATHETERS) IMPLANT
ELECT DEFIB PAD ADLT CADENCE (PAD) ×2 IMPLANT
KIT ENCORE 26 ADVANTAGE (KITS) ×2 IMPLANT
KIT HEART LEFT (KITS) ×2 IMPLANT
KIT HEMO VALVE WATCHDOG (MISCELLANEOUS) ×2 IMPLANT
PACK CARDIAC CATHETERIZATION (CUSTOM PROCEDURE TRAY) ×2 IMPLANT
SHEATH PINNACLE 6F 10CM (SHEATH) ×2 IMPLANT
SHEATH PROBE COVER 6X72 (BAG) ×2 IMPLANT
STENT RESOLUTE ONYX 2.0X26 (Permanent Stent) ×2 IMPLANT
TRANSDUCER W/STOPCOCK (MISCELLANEOUS) ×2 IMPLANT
TUBING CIL FLEX 10 FLL-RA (TUBING) ×2 IMPLANT
WIRE ASAHI PROWATER 180CM (WIRE) ×2 IMPLANT
WIRE EMERALD 3MM-J .035X150CM (WIRE) ×2 IMPLANT
WIRE FIGHTER CROSSING 190CM (WIRE) ×4 IMPLANT

## 2017-09-28 NOTE — Progress Notes (Signed)
Patient received on unit at 1545.  At 1800 right femoral site developed hematoma approx. 10 cm X 5 cm.  Manual pressure applied X 10 minutes with resolution.  Area soft, skin marked.  At 1820 hematoma had again began to get firm, manual pressure held X 10 minutes with resolution.  Area soft.  At 1850 hematoma again getting firm, manual pressure applied X 20 minutes.  Area soft.  Night RN to continue management, remove sheath as soon as ACT below 175.

## 2017-09-28 NOTE — Interval H&P Note (Signed)
Cath Lab Visit (complete for each Cath Lab visit)  Clinical Evaluation Leading to the Procedure:   ACS: No.  Non-ACS:    Anginal Classification: CCS III  Anti-ischemic medical therapy: Minimal Therapy (1 class of medications)  Non-Invasive Test Results: Intermediate-risk stress test findings: cardiac mortality 1-3%/year  Prior CABG: No previous CABG   LV dysfunction noted on echo   History and Physical Interval Note:  09/28/2017 1:45 PM  Danielle Bell  has presented today for surgery, with the diagnosis of cad  The various methods of treatment have been discussed with the patient and family. After consideration of risks, benefits and other options for treatment, the patient has consented to  Procedure(s): CORONARY STENT INTERVENTION (N/A) as a surgical intervention .  The patient's history has been reviewed, patient examined, no change in status, stable for surgery.  I have reviewed the patient's chart and labs.  Questions were answered to the patient's satisfaction.     Lance Muss

## 2017-09-28 NOTE — H&P (View-Only) (Signed)
 Progress Note  Patient Name: Danielle Bell Date of Encounter: 09/28/2017  Primary Cardiologist: Suresh Koneswaran, MD   Subjective   No chest pain or dyspnea. Does not want cath.   Inpatient Medications    Scheduled Meds: . aspirin EC  81 mg Oral Daily  . atorvastatin  20 mg Oral Daily  . fluticasone  1 spray Each Nare Daily  . levothyroxine  88 mcg Oral QAC breakfast  . metoprolol succinate  12.5 mg Oral BID  . pantoprazole  40 mg Oral Daily  . sodium chloride flush  3 mL Intravenous Q12H   Continuous Infusions: . sodium chloride     PRN Meds: sodium chloride, acetaminophen, ondansetron (ZOFRAN) IV, polyethylene glycol powder, sodium chloride flush, traMADol   Vital Signs    Vitals:   09/27/17 2019 09/27/17 2020 09/27/17 2203 09/28/17 0500  BP:  (!) 108/58 114/66 130/79  Pulse: 71 76 83 81  Resp:  18  18  Temp:  (!) 97.5 F (36.4 C)  97.8 F (36.6 C)  TempSrc:  Oral  Oral  SpO2: 98%   100%  Weight:    68.3 kg  Height:        Intake/Output Summary (Last 24 hours) at 09/28/2017 0741 Last data filed at 09/28/2017 0300 Gross per 24 hour  Intake 468 ml  Output 200 ml  Net 268 ml   Filed Weights   09/27/17 0935 09/27/17 1500 09/28/17 0500  Weight: 68.5 kg 68.4 kg 68.3 kg    Telemetry    SR with PVCs- Personally Reviewed  ECG   N/A  Physical Exam   GEN: No acute distress.   Neck: No JVD Cardiac: RRR, no murmurs, rubs, or gallops. R radial hematoma with extensive ecchymosis  Respiratory: Clear to auscultation bilaterally. GI: Soft, nontender, non-distended  MS: No edema; No deformity. Neuro:  Nonfocal  Psych: Normal affect   Labs    Chemistry Recent Labs  Lab 09/23/17 1354 09/28/17 0502  NA 126* 131*  K 4.3 3.6  CL 94* 96*  CO2 22 21*  GLUCOSE 111 109*  BUN 15 11  CREATININE 1.08* 1.08*  CALCIUM 10.6* 10.6*  GFRNONAA  --  45*  GFRAA  --  52*  ANIONGAP  --  14     Hematology Recent Labs  Lab 09/23/17 1354  WBC 4.3  RBC  3.83  HGB 12.7  HCT 36.7  MCV 95.8  MCH 33.2*  MCHC 34.6  RDW 13.6  PLT 153    Radiology    No results found.  Cardiac Studies   RIGHT/LEFT HEART CATH AND CORONARY ANGIOGRAPHY  Conclusion     Prox RCA to Mid RCA lesion is 25% stenosed.  Mid LAD lesion is 75% stenosed. Attmepted PCI but unable to cross with a wire due to severe toruosity.  Prox Cx to Mid Cx lesion is 25% stenosed.  LV end diastolic pressure is mildly elevated.  There is no aortic valve stenosis.  Hemodynamic findings consistent with moderate pulmonary hypertension. Mean PA pressure 42 mm Hg.  CO 5.7 L/min; CI 3.3; PA 63/27, mean PCWP 42 mm Hg; Ao sat 95%, PA sat 72%   PLan for PCI attempt tomorrow from the right groin. Holding off on oral antiplatelet therapy until wire is across, in the event that PCI cannot be done and cardiac surgery consult is required.   Discussed with patient's family and Dr. Koneswaran.  Patient not having angina.  WIll see if EF improved from July study.    If so, would consider medical therapy for her CAD given the tortuosity.  If there is an anterior wall motion abnormality, then would plan attempt of LAD PCI.  DOE could be from pulmonary HTN.    Diagnostic Diagram        Patient Profile     84 y.o. female with hx of chronic systolic CHF, LBBB, HTN and abnormal stress test presented for cath.   Assessment & Plan    1. CAD - Cath showed 75% mLAD lesion. Unable to cross wire. Later developed large radial hematoma, now stable.  - Plan to repeat echo >> then possible PCI. However, patient prefers medication therapy. Will keep on cath board until final discussion with MD. Currently chest pain free. Continue ASA, statin and BB.   2. Chronic systolic CHF/ICM - Pending repeat echo today. Euvolemic. RHC as above. Previously DC Entresto and reduce BB due to hypotension. BP stable currently.  - Continue low dose BB. Add low dose ARB. May consider low dose spironolactone if renal  function and blood pressure remain stable.   3. HTN - STable BP. As above  4. HLD - increase lipitor to 40. Check lipids in 6 weeks.   5. Hematoma - Stable. Check CBC.    For questions or updates, please contact [facility] Please consult [email or web address] for contact info under      Signed, Bhavinkumar Bhagat, PA  09/28/2017, 7:41 AM    Patient seen, examined. Available data reviewed. Agree with findings, assessment, and plan as outlined by Vin Bhagat, PA-C. The patient's exam shows an elderly woman in NAD. JVP is normal, lungs are clear. Heart is RRR with no murmur, abdomen is soft and nontender. There is no peripheral edema. The right and left forearms have ecchymoses but no firm hematoma. I have personally reviewed the patient's cath and echo images. She has severe LV dysfunction with segment wall motion abnormalities c/w LAD-territory infarct/ischemia. I've reviewed pros and cons of repeat attempt at PCI of the LAD and have also discussed with Dr Varanasi. After review of potential benefits, but also understanding risks of peri-procedural infarction or other procedure-related complication, the patient has decided she would like to proceed. Her son and daughter are present at the bedside for this discussion. Will hold off on plavix loading because of high-risk coronary anatomy. I do not think the patient would be a candidate for CABG at age 84 with severe LV dysfunction.  Neko Mcgeehan, M.D. 09/28/2017 11:00 AM   

## 2017-09-28 NOTE — Progress Notes (Signed)
  Echocardiogram 2D Echocardiogram has been performed.  Danielle Bell 09/28/2017, 10:57 AM

## 2017-09-28 NOTE — Progress Notes (Addendum)
Site area: right groin  Site Prior to Removal:  Level 2 ( Hematoma 10 x 5 cm)  Pressure Applied For 30 MINUTES    Minutes Beginning at 20:16  Manual:   Yes.    Patient Status During Pull:  Pt vagaled during sheath pull  Post Pull Groin Site:  Level 1  Post Pull Instructions Given:  Yes.    Post Pull Pulses Present:  Yes.    Dressing Applied:  Yes.    Comments:  Pt had vagaled episode during sheath pulled. BP=89/41; HR=66; pt feels warm & sweaty. NS 250 ml bolus given as protocol. After sheath pulled BP=101/28; HR=61. Will continue to monitor pt.

## 2017-09-28 NOTE — Care Management Note (Signed)
Case Management Note  Patient Details  Name: Danielle Bell MRN: [id number] Date of Birth: 1927/02/20  Subjective/Objective:  Pt presented 2/2 abnormal stress test- plan for PCI 09-28-17. PTA from home with support of son. Pt has DME Cane at home.                  Action/Plan: CM will continue to monitor for additional disposition needs.   Expected Discharge Date:                  Expected Discharge Plan:  Home/Self Care  In-House Referral:  NA  Discharge planning Services  CM Consult  Post Acute Care Choice:    Choice offered to:     DME Arranged:    DME Agency:     HH Arranged:    HH Agency:     Status of Service:  In process, will continue to follow  If discussed at Long Length of Stay Meetings, dates discussed:    Additional Comments:  [name], RN 09/28/2017, 11:39 AM

## 2017-09-28 NOTE — Progress Notes (Addendum)
Progress Note  Patient Name: Danielle Bell Date of Encounter: 09/28/2017  Primary Cardiologist: Prentice Docker, MD   Subjective   No chest pain or dyspnea. Does not want cath.   Inpatient Medications    Scheduled Meds: . aspirin EC  81 mg Oral Daily  . atorvastatin  20 mg Oral Daily  . fluticasone  1 spray Each Nare Daily  . levothyroxine  88 mcg Oral QAC breakfast  . metoprolol succinate  12.5 mg Oral BID  . pantoprazole  40 mg Oral Daily  . sodium chloride flush  3 mL Intravenous Q12H   Continuous Infusions: . sodium chloride     PRN Meds: sodium chloride, acetaminophen, ondansetron (ZOFRAN) IV, polyethylene glycol powder, sodium chloride flush, traMADol   Vital Signs    Vitals:   09/27/17 2019 09/27/17 2020 09/27/17 2203 09/28/17 0500  BP:  (!) 108/58 114/66 130/79  Pulse: 71 76 83 81  Resp:  18  18  Temp:  (!) 97.5 F (36.4 C)  97.8 F (36.6 C)  TempSrc:  Oral  Oral  SpO2: 98%   100%  Weight:    68.3 kg  Height:        Intake/Output Summary (Last 24 hours) at 09/28/2017 0741 Last data filed at 09/28/2017 0300 Gross per 24 hour  Intake 468 ml  Output 200 ml  Net 268 ml   Filed Weights   09/27/17 0935 09/27/17 1500 09/28/17 0500  Weight: 68.5 kg 68.4 kg 68.3 kg    Telemetry    SR with PVCs- Personally Reviewed  ECG   N/A  Physical Exam   GEN: No acute distress.   Neck: No JVD Cardiac: RRR, no murmurs, rubs, or gallops. R radial hematoma with extensive ecchymosis  Respiratory: Clear to auscultation bilaterally. GI: Soft, nontender, non-distended  MS: No edema; No deformity. Neuro:  Nonfocal  Psych: Normal affect   Labs    Chemistry Recent Labs  Lab 09/23/17 1354 09/28/17 0502  NA 126* 131*  K 4.3 3.6  CL 94* 96*  CO2 22 21*  GLUCOSE 111 109*  BUN 15 11  CREATININE 1.08* 1.08*  CALCIUM 10.6* 10.6*  GFRNONAA  --  45*  GFRAA  --  52*  ANIONGAP  --  14     Hematology Recent Labs  Lab 09/23/17 1354  WBC 4.3  RBC  3.83  HGB 12.7  HCT 36.7  MCV 95.8  MCH 33.2*  MCHC 34.6  RDW 13.6  PLT 153    Radiology    No results found.  Cardiac Studies   RIGHT/LEFT HEART CATH AND CORONARY ANGIOGRAPHY  Conclusion     Prox RCA to Mid RCA lesion is 25% stenosed.  Mid LAD lesion is 75% stenosed. Attmepted PCI but unable to cross with a wire due to severe toruosity.  Prox Cx to Mid Cx lesion is 25% stenosed.  LV end diastolic pressure is mildly elevated.  There is no aortic valve stenosis.  Hemodynamic findings consistent with moderate pulmonary hypertension. Mean PA pressure 42 mm Hg.  CO 5.7 L/min; CI 3.3; PA 63/27, mean PCWP 42 mm Hg; Ao sat 95%, PA sat 72%   PLan for PCI attempt tomorrow from the right groin. Holding off on oral antiplatelet therapy until wire is across, in the event that PCI cannot be done and cardiac surgery consult is required.   Discussed with patient's family and Dr. Purvis Sheffield.  Patient not having angina.  WIll see if EF improved from July study.  If so, would consider medical therapy for her CAD given the tortuosity.  If there is an anterior wall motion abnormality, then would plan attempt of LAD PCI.  DOE could be from pulmonary HTN.    Diagnostic Diagram        Patient Profile     84 y.o. female with hx of chronic systolic CHF, LBBB, HTN and abnormal stress test presented for cath.   Assessment & Plan    1. CAD - Cath showed 75% mLAD lesion. Unable to cross wire. Later developed large radial hematoma, now stable.  - Plan to repeat echo >> then possible PCI. However, patient prefers medication therapy. Will keep on cath board until final discussion with MD. Currently chest pain free. Continue ASA, statin and BB.   2. Chronic systolic CHF/ICM - Pending repeat echo today. Euvolemic. RHC as above. Previously DC Entresto and reduce BB due to hypotension. BP stable currently.  - Continue low dose BB. Add low dose ARB. May consider low dose spironolactone if renal  function and blood pressure remain stable.   3. HTN - STable BP. As above  4. HLD - increase lipitor to 40. Check lipids in 6 weeks.   5. Hematoma - Stable. Check CBC.    For questions or updates, please contact [facility] Please consult [email or web address] for contact info under      SignedManson Passey, PA  09/28/2017, 7:41 AM    Patient seen, examined. Available data reviewed. Agree with findings, assessment, and plan as outlined by Chelsea Aus, PA-C. The patient's exam shows an elderly woman in NAD. JVP is normal, lungs are clear. Heart is RRR with no murmur, abdomen is soft and nontender. There is no peripheral edema. The right and left forearms have ecchymoses but no firm hematoma. I have personally reviewed the patient's cath and echo images. She has severe LV dysfunction with segment wall motion abnormalities c/w LAD-territory infarct/ischemia. I've reviewed pros and cons of repeat attempt at PCI of the LAD and have also discussed with Dr Eldridge Dace. After review of potential benefits, but also understanding risks of peri-procedural infarction or other procedure-related complication, the patient has decided she would like to proceed. Her son and daughter are present at the bedside for this discussion. Will hold off on plavix loading because of high-risk coronary anatomy. I do not think the patient would be a candidate for CABG at age 84 with severe LV dysfunction.  Tonny Bollman, M.D. 09/28/2017 11:00 AM

## 2017-09-29 ENCOUNTER — Encounter (HOSPITAL_COMMUNITY): Payer: Self-pay | Admitting: Interventional Cardiology

## 2017-09-29 LAB — BASIC METABOLIC PANEL
Anion gap: 11 (ref 5–15)
BUN: 11 mg/dL (ref 8–23)
CO2: 19 mmol/L — AB (ref 22–32)
Calcium: 9.5 mg/dL (ref 8.9–10.3)
Chloride: 101 mmol/L (ref 98–111)
Creatinine, Ser: 1.07 mg/dL — ABNORMAL HIGH (ref 0.44–1.00)
GFR calc Af Amer: 53 mL/min — ABNORMAL LOW (ref >60)
GFR calc non Af Amer: 45 mL/min — ABNORMAL LOW (ref >60)
GLUCOSE: 120 mg/dL — AB (ref 70–99)
POTASSIUM: 4 mmol/L (ref 3.5–5.1)
Sodium: 131 mmol/L — ABNORMAL LOW (ref 135–145)

## 2017-09-29 LAB — CBC
HEMATOCRIT: 27.8 % — AB (ref 36.0–46.0)
Hemoglobin: 9.3 g/dL — ABNORMAL LOW (ref 12.0–15.0)
MCH: 33.2 pg (ref 26.0–34.0)
MCHC: 33.5 g/dL (ref 30.0–36.0)
MCV: 99.3 fL (ref 78.0–100.0)
Platelets: 124 10*3/uL — ABNORMAL LOW (ref 150–400)
RBC: 2.8 MIL/uL — AB (ref 3.87–5.11)
RDW: 15 % (ref 11.5–15.5)
WBC: 7 10*3/uL (ref 4.0–10.5)

## 2017-09-29 LAB — MAGNESIUM: Magnesium: 1.7 mg/dL (ref 1.7–2.4)

## 2017-09-29 MED ORDER — MAGNESIUM SULFATE 2 GM/50ML IV SOLN
2.0000 g | Freq: Once | INTRAVENOUS | Status: AC
  Administered 2017-09-29: 2 g via INTRAVENOUS
  Filled 2017-09-29: qty 50

## 2017-09-29 MED ORDER — LOSARTAN POTASSIUM 25 MG PO TABS
12.5000 mg | ORAL_TABLET | Freq: Every day | ORAL | Status: DC
  Filled 2017-09-29: qty 0.5

## 2017-09-29 NOTE — Progress Notes (Signed)
CARDIAC REHAB PHASE I   Education completed with pt and son. Pt educated on importance of Plavix, ASA, statin, and NTG. Stent card at bedside. Pt given heart healthy and low sodium diets. Reviewed CHF booklet with pt and son, emphasized importance of daily weights. Pt educated on restrictions and exercise guidelines, encouraged to walk as much as able.  Offered to walk with pt, right before walk, pt had a run of V-tach and pressures were in the 70s. RN made aware.   [id number] Reynold Bowen, RN BSN 09/29/2017 8:50 AM

## 2017-09-29 NOTE — Progress Notes (Signed)
SB/P 70's -80's while in bed, denies discomforts. Short run of SVT noted  per monitor. Lab result reviewed , patient seen  evaluated by Laverda Page PA

## 2017-09-29 NOTE — Progress Notes (Addendum)
Progress Note  Patient Name: Danielle Bell Date of Encounter: 09/29/2017  Primary Cardiologist: Prentice Docker, MD  Subjective   No complaints this morning. No chest pain.   Inpatient Medications    Scheduled Meds: . aspirin EC  81 mg Oral Daily  . atorvastatin  40 mg Oral Daily  . clopidogrel  75 mg Oral Q breakfast  . fluticasone  1 spray Each Nare Daily  . heparin  5,000 Units Subcutaneous Q8H  . levothyroxine  88 mcg Oral QAC breakfast  . losartan  12.5 mg Oral Daily  . metoprolol succinate  12.5 mg Oral BID  . pantoprazole  40 mg Oral Daily  . sodium chloride flush  3 mL Intravenous Q12H  . sodium chloride flush  3 mL Intravenous Q12H   Continuous Infusions: . sodium chloride    . sodium chloride    . sodium chloride 999 mL/hr at 09/28/17 2117   PRN Meds: sodium chloride, sodium chloride, acetaminophen, HYDROcodone-acetaminophen, ondansetron (ZOFRAN) IV, polyethylene glycol powder, sodium chloride flush, sodium chloride flush, traMADol   Vital Signs    Vitals:   09/29/17 0100 09/29/17 0258 09/29/17 0422 09/29/17 0709  BP: (!) 109/39 (!) 110/33 (!) 110/34 (!) 98/38  Pulse:   69 61  Resp: 11 18 18 19   Temp:   (!) 97.5 F (36.4 C) 98 F (36.7 C)  TempSrc:   Oral   SpO2: 100% 98% 100% 97%  Weight:   68 kg   Height:        Intake/Output Summary (Last 24 hours) at 09/29/2017 0913 Last data filed at 09/29/2017 0700 Gross per 24 hour  Intake 835 ml  Output 350 ml  Net 485 ml   Filed Weights   09/27/17 1500 09/28/17 0500 09/29/17 0422  Weight: 68.4 kg 68.3 kg 68 kg    Telemetry    SR with short runs of NSVT - Personally Reviewed  ECG    SR with LBBB - Personally Reviewed  Physical Exam   General: Well developed, well nourished, female appearing in no acute distress. Head: Normocephalic, atraumatic.  Neck: Supple, no JVD. Lungs:  Resp regular and unlabored, CTA. Heart: RRR, S1, S2, soft systolic murmur; no rub. Abdomen: Soft, non-tender,  non-distended with normoactive bowel sounds.  Extremities: No clubbing, cyanosis, edema. Distal pedal pulses are 2+ bilaterally. Right radial/ femoral site with significant bruising but no bruit noted. Areas are soft.  Neuro: Alert and oriented X 3. Moves all extremities spontaneously. Psych: Normal affect.  Labs    Chemistry Recent Labs  Lab 09/23/17 1354 09/28/17 0502 09/29/17 0217  NA 126* 131* 131*  K 4.3 3.6 4.0  CL 94* 96* 101  CO2 22 21* 19*  GLUCOSE 111 109* 120*  BUN 15 11 11   CREATININE 1.08* 1.08* 1.07*  CALCIUM 10.6* 10.6* 9.5  GFRNONAA  --  45* 45*  GFRAA  --  52* 53*  ANIONGAP  --  14 11     Hematology Recent Labs  Lab 09/23/17 1354 09/28/17 0815 09/29/17 0217  WBC 4.3 4.0 7.0  RBC 3.83 3.70* 2.80*  HGB 12.7 12.2 9.3*  HCT 36.7 36.1 27.8*  MCV 95.8 97.6 99.3  MCH 33.2* 33.0 33.2  MCHC 34.6 33.8 33.5  RDW 13.6 14.9 15.0  PLT 153 133* 124*    Cardiac EnzymesNo results for input(s): TROPONINI in the last 168 hours. No results for input(s): TROPIPOC in the last 168 hours.   BNPNo results for input(s): BNP, PROBNP in the  last 168 hours.   DDimer No results for input(s): DDIMER in the last 168 hours.    Radiology    No results found.  Cardiac Studies   Cath: 09/27/17   Prox RCA to Mid RCA lesion is 25% stenosed.  Mid LAD lesion is 75% stenosed. Attmepted PCI but unable to cross with a wire due to severe toruosity.  Prox Cx to Mid Cx lesion is 25% stenosed.  LV end diastolic pressure is mildly elevated.  There is no aortic valve stenosis.  Hemodynamic findings consistent with moderate pulmonary hypertension. Mean PA pressure 42 mm Hg.  CO 5.7 L/min; CI 3.3; PA 63/27, mean PCWP 42 mm Hg; Ao sat 95%, PA sat 72%   PLan for PCI attempt tomorrow from the right groin. Holding off on oral antiplatelet therapy until wire is across, in the event that PCI cannot be done and cardiac surgery consult is required.   Discussed with patient's family  and Dr. Purvis Sheffield.  Patient not having angina.  WIll see if EF improved from July study.  If so, would consider medical therapy for her CAD given the tortuosity.  If there is an anterior wall motion abnormality, then would plan attempt of LAD PCI.  DOE could be from pulmonary HTN.    Cath: 09/28/17   Prox Cx to Mid Cx lesion is 25% stenosed.  Mid LAD lesion is 75% stenosed.  A drug-eluting stent was successfully placed using a STENT RESOLUTE ONYX 2.0X26.  Post intervention, there is a 0% residual stenosis.  Ost 2nd Diag to 2nd Diag lesion is 75% stenosed.  Balloon angioplasty was performed using a BALLOON SAPPHIRE 2.0X12.  Post intervention, there is a 40% residual stenosis.    Recommend uninterrupted dual antiplatelet therapy with Aspirin 81mg  daily and Clopidogrel 75mg  daily for a minimum of 6 months (stable ischemic heart disease - Class I recommendation).   Continue aggressive secondary prevention. Continue therapy for heart failure.  TTE: 09/28/17  Study Conclusions  - Left ventricle: The cavity size was mildly dilated. There was   moderate focal basal hypertrophy. Systolic function was severely   reduced. The estimated ejection fraction was in the range of 25%   to 30%. Wall motion was normal; there were no regional wall   motion abnormalities. The study was not technically sufficient to   allow evaluation of LV diastolic dysfunction due to atrial   fibrillation. Doppler parameters are consistent with high   ventricular filling pressure. - Aortic valve: There was mild to moderate regurgitation. Valve   area (VTI): 4.04 cm^2. Valve area (Vmax): 3.14 cm^2. Valve area   (Vmean): 3.02 cm^2. - Mitral valve: There was moderate to severe regurgitation likely   related to annular dilatation, - Left atrium: The atrium was mildly dilated. - Right ventricle: The cavity size was mildly dilated. Wall   thickness was normal. - Right atrium: The atrium was moderately dilated. -  Tricuspid valve: There was mild regurgitation. - Pulmonic valve: There was mild regurgitation. - Pulmonary arteries: PA peak pressure: 56 mm Hg (S).  Impressions:  - The findings indicate significant septal-lateral left ventricular   wall dyssynchrony. The right ventricular systolic pressure was   increased consistent with moderate pulmonary hypertension.  Patient Profile     84 y.o. female with hx of chronic systolic CHF, LBBB, HTN and abnormal stress test presented for cath. Underwent cath noted above.   Assessment & Plan    1. CAD - Cath showed 75% mLAD lesion. Unable to cross  wire. Later developed large radial hematoma, now stable. Echo showed EF of 25-30% and decision made to attempt PCI via femoral access. Successful PCI/DES x1 to the LAD and balloon angioplasty  to the 2nd diag. Plan for DAPT with ASA/plavix for at least 6 months. No further chest pain.  - Continue ASA, statin, BB and .   2. Chronic systolic CHF/ICM - Echo noted above with 25-30% with no WMA. Attempted to place on BB and ARB but blood pressures remain soft. Will need to hold medications this morning.   3. HTN - soft this this morning. Holding meds.   4. HLD - increased lipitor to 40. Check lipids in 6 weeks.   5. Hematoma - developed radial hematoma after first cath. Last evening with groin hematoma post sheath pull. Stable this morning but Hgb dropped from 12.2>>9.3. No back pain.  - follow CBC.   6. NSVT: noted to have several episodes on telemetry, with drops in blood pressure with these episodes. Unable to titrate BB at this time.  -- check mag level today.  Signed, Laverda Page, NP  09/29/2017, 9:13 AM  Pager # [phone]   For questions or updates, please contact [facility] Please consult [email or web address] for contact info under Cardiology/STEMI.  Patient seen, examined. Available data reviewed. Agree with findings, assessment, and plan as outlined by Laverda Page, NP.  Patient is  independently interviewed and examined.  On exam she is an elderly woman in no distress.  Lung fields are clear, heart is regular rate and rhythm with a grade 2/6 systolic murmur at the left lower sternal border, abdomen is soft and nontender, there is ecchymoses over the right groin site but no firm hematoma.  There is no pretibial edema.  Telemetry is reviewed and demonstrates episodes of nonsustained ventricular tachycardia.  Her heart rhythm is stable this morning.  I agree with holding her antihypertensive medications today and following her blood pressure closely.  Will slowly mobilize her and get her up in a chair today.  Fortunately she underwent successful PCI of complex disease in the LAD yesterday and now will remain on aspirin and clopidogrel.  Hopefully LV function will improve after revascularization.  Discussed plan with the patient and her son.  The patient lives with her son and has good support at home.  Tonny Bollman, M.D. 09/29/2017 10:30 AM

## 2017-09-29 NOTE — Discharge Instructions (Addendum)
Information about your medication: Plavix (anti-platelet agent) ° °Generic Name (Brand): clopidogrel (Plavix), once daily medication ° °PURPOSE: You are taking this medication along with aspirin to lower your chance of having a heart attack, stroke, or blood clots in your heart stent. These can be fatal. Brilinta and aspirin help prevent platelets from sticking together and forming a clot that can block an artery or your stent.  ° °Common SIDE EFFECTS you may experience include: bruising or bleeding more easily, shortness of breath ° °Do not stop taking PLAVIX without talking to the doctor who prescribes it for you. People who are treated with a stent and stop taking Plavix too soon, have a higher risk of getting a blood clot in the stent, having a heart attack, or dying. If you stop Plavix because of bleeding, or for other reasons, your risk of a heart attack or stroke may increase.  ° °Tell all of your doctors and dentists that you are taking Plavix. They should talk to the doctor who prescribed plavix for you before you have any surgery or invasive procedure.  ° °Contact your health care provider if you experience: severe or uncontrollable bleeding, pink/red/brown urine, vomiting blood or vomit that looks like "coffee grounds", red or black stools (looks like tar), coughing up blood or blood clots °---------------------------------------------------------------------------------------------------------------------- ° °

## 2017-09-30 ENCOUNTER — Inpatient Hospital Stay (HOSPITAL_COMMUNITY): Payer: Medicare Other

## 2017-09-30 DIAGNOSIS — I5022 Chronic systolic (congestive) heart failure: Secondary | ICD-10-CM

## 2017-09-30 LAB — CBC
HEMATOCRIT: 24.3 % — AB (ref 36.0–46.0)
HEMATOCRIT: 22.9 % — AB (ref 36.0–46.0)
HEMATOCRIT: 27.4 % — AB (ref 36.0–46.0)
HEMOGLOBIN: 8.1 g/dL — AB (ref 12.0–15.0)
HEMOGLOBIN: 7.7 g/dL — AB (ref 12.0–15.0)
HEMOGLOBIN: 9.3 g/dL — AB (ref 12.0–15.0)
MCH: 33.1 pg (ref 26.0–34.0)
MCH: 33.5 pg (ref 26.0–34.0)
MCH: 32.1 pg (ref 26.0–34.0)
MCHC: 33.3 g/dL (ref 30.0–36.0)
MCHC: 33.6 g/dL (ref 30.0–36.0)
MCHC: 33.9 g/dL (ref 30.0–36.0)
MCV: 99.2 fL (ref 78.0–100.0)
MCV: 99.6 fL (ref 78.0–100.0)
MCV: 94.5 fL (ref 78.0–100.0)
PLATELETS: 117 10*3/uL — AB (ref 150–400)
Platelets: 125 10*3/uL — ABNORMAL LOW (ref 150–400)
Platelets: 121 10*3/uL — ABNORMAL LOW (ref 150–400)
RBC: 2.45 MIL/uL — AB (ref 3.87–5.11)
RBC: 2.3 MIL/uL — ABNORMAL LOW (ref 3.87–5.11)
RBC: 2.9 MIL/uL — AB (ref 3.87–5.11)
RDW: 15.3 % (ref 11.5–15.5)
RDW: 15.4 % (ref 11.5–15.5)
RDW: 16.9 % — ABNORMAL HIGH (ref 11.5–15.5)
WBC: 5.3 10*3/uL (ref 4.0–10.5)
WBC: 4.7 10*3/uL (ref 4.0–10.5)
WBC: 4.4 10*3/uL (ref 4.0–10.5)

## 2017-09-30 LAB — BASIC METABOLIC PANEL
Anion gap: 7 (ref 5–15)
BUN: 12 mg/dL (ref 8–23)
CHLORIDE: 103 mmol/L (ref 98–111)
CO2: 22 mmol/L (ref 22–32)
Calcium: 9.5 mg/dL (ref 8.9–10.3)
Creatinine, Ser: 1.13 mg/dL — ABNORMAL HIGH (ref 0.44–1.00)
GFR calc Af Amer: 49 mL/min — ABNORMAL LOW (ref >60)
GFR calc non Af Amer: 42 mL/min — ABNORMAL LOW (ref >60)
GLUCOSE: 111 mg/dL — AB (ref 70–99)
Potassium: 3.8 mmol/L (ref 3.5–5.1)
Sodium: 132 mmol/L — ABNORMAL LOW (ref 135–145)

## 2017-09-30 LAB — MAGNESIUM: MAGNESIUM: 2.1 mg/dL (ref 1.7–2.4)

## 2017-09-30 LAB — ABO/RH: ABO/RH(D): O POS

## 2017-09-30 LAB — PREPARE RBC (CROSSMATCH)

## 2017-09-30 MED ORDER — POLYETHYLENE GLYCOL 3350 17 G PO PACK
17.0000 g | PACK | Freq: Every day | ORAL | Status: DC | PRN
  Administered 2017-09-30: 17 g via ORAL
  Filled 2017-09-30: qty 1

## 2017-09-30 MED ORDER — METOPROLOL SUCCINATE ER 25 MG PO TB24
12.5000 mg | ORAL_TABLET | Freq: Every day | ORAL | Status: DC
  Filled 2017-09-30: qty 1

## 2017-09-30 MED ORDER — SODIUM CHLORIDE 0.9% IV SOLUTION
Freq: Once | INTRAVENOUS | Status: AC
  Administered 2017-09-30: 14:00:00 via INTRAVENOUS

## 2017-09-30 MED ORDER — FUROSEMIDE 10 MG/ML IJ SOLN
20.0000 mg | Freq: Once | INTRAMUSCULAR | Status: AC
  Administered 2017-09-30: 18:00:00 20 mg via INTRAVENOUS
  Filled 2017-09-30 (×2): qty 2

## 2017-09-30 NOTE — Progress Notes (Signed)
CBC improved. Note CT of Abd report:  IMPRESSION: 1. No retroperitoneal or proximal thigh hematoma seen. 2. Small amount of medium density presacral fluid. This could represent proteinaceous fluid or subacute hemorrhagic fluid. 3. Minimal simple fluid in the right posterior pararenal space. 4. Bilateral subcutaneous edema, most pronounced in the right pelvis and bilateral vulvar regions. 5. Cholelithiasis. 6. Small right pleural effusion and small to moderate-sized left pleural effusion. 7. Bilateral lower lobe atelectasis, greater on the left. 8. Small pericardial effusion. 9. Atheromatous calcifications, including the coronary arteries. 10. Stable small, nonobstructing left renal calculus.   Continue to monitor for now. Will inform fellow  Signed, Azalee Course PA Pager: [phone]

## 2017-09-30 NOTE — Progress Notes (Addendum)
Progress Note  Patient Name: Danielle Bell Date of Encounter: 09/30/2017  Primary Cardiologist: Prentice Docker, MD   Subjective   No chest pain or SOB. Intermittent back pain.   Inpatient Medications    Scheduled Meds: . aspirin EC  81 mg Oral Daily  . atorvastatin  40 mg Oral Daily  . clopidogrel  75 mg Oral Q breakfast  . fluticasone  1 spray Each Nare Daily  . heparin  5,000 Units Subcutaneous Q8H  . levothyroxine  88 mcg Oral QAC breakfast  . metoprolol succinate  12.5 mg Oral BID  . pantoprazole  40 mg Oral Daily  . sodium chloride flush  3 mL Intravenous Q12H  . sodium chloride flush  3 mL Intravenous Q12H   Continuous Infusions: . sodium chloride    . sodium chloride    . sodium chloride 999 mL/hr at 09/28/17 2117   PRN Meds: sodium chloride, sodium chloride, acetaminophen, HYDROcodone-acetaminophen, ondansetron (ZOFRAN) IV, polyethylene glycol powder, sodium chloride flush, sodium chloride flush, traMADol   Vital Signs    Vitals:   09/29/17 2000 09/29/17 2020 09/29/17 2231 09/30/17 0228  BP:  (!) 113/35 (!) 97/43 (!) 121/39  Pulse:  78  93  Resp: 19 18 18 20   Temp:  98.2 F (36.8 C)  98.2 F (36.8 C)  TempSrc:  Oral  Oral  SpO2:  92% 100% 99%  Weight:    68.5 kg  Height:        Intake/Output Summary (Last 24 hours) at 09/30/2017 0750 Last data filed at 09/30/2017 0548 Gross per 24 hour  Intake 240 ml  Output 650 ml  Net -410 ml   Filed Weights   09/28/17 0500 09/29/17 0422 09/30/17 0228  Weight: 68.3 kg 68 kg 68.5 kg    Telemetry    SR with intermittent PVCs - Personally Reviewed  ECG    SR with LBBB - Personally Reviewed  Physical Exam   GEN: Thin frail elderly female in no acute distress.   Neck: No JVD Cardiac: RRR, no murmurs, rubs, or gallops.  Respiratory: Clear to auscultation bilaterally. GI: Soft, nontender, non-distended  MS: No edema; No deformity. Extremity: Distal pedal pulses are 2+ bilaterally. Right radial/  femoral site (extending to back - marking are done) with significant bruising but no bruit noted. Areas are soft.  Neuro:  Nonfocal  Psych: Normal affect   Labs    Chemistry Recent Labs  Lab 09/23/17 1354 09/28/17 0502 09/29/17 0217  NA 126* 131* 131*  K 4.3 3.6 4.0  CL 94* 96* 101  CO2 22 21* 19*  GLUCOSE 111 109* 120*  BUN 15 11 11   CREATININE 1.08* 1.08* 1.07*  CALCIUM 10.6* 10.6* 9.5  GFRNONAA  --  45* 45*  GFRAA  --  52* 53*  ANIONGAP  --  14 11     Hematology Recent Labs  Lab 09/28/17 0815 09/29/17 0217 09/30/17 0252  WBC 4.0 7.0 5.3  RBC 3.70* 2.80* 2.45*  HGB 12.2 9.3* 8.1*  HCT 36.1 27.8* 24.3*  MCV 97.6 99.3 99.2  MCH 33.0 33.2 33.1  MCHC 33.8 33.5 33.3  RDW 14.9 15.0 15.3  PLT 133* 124* 125*    Radiology    No results found.  Cardiac Studies   Cath: 09/27/17   Prox RCA to Mid RCA lesion is 25% stenosed.  Mid LAD lesion is 75% stenosed. Attmepted PCI but unable to cross with a wire due to severe toruosity.  Prox Cx to Mid  Cx lesion is 25% stenosed.  LV end diastolic pressure is mildly elevated.  There is no aortic valve stenosis.  Hemodynamic findings consistent with moderate pulmonary hypertension. Mean PA pressure 42 mm Hg.  CO 5.7 L/min; CI 3.3; PA 63/27, mean PCWP 42 mm Hg; Ao sat 95%, PA sat 72%  PLan for PCI attempt tomorrow from the right groin. Holding off on oral antiplatelet therapy until wire is across, in the event that PCI cannot be done and cardiac surgery consult is required.   Discussed with patient's family and Dr. Purvis Sheffield. Patient not having angina. WIll see if EF improved from July study. If so, would consider medical therapy for her CAD given the tortuosity. If there is an anterior wall motion abnormality, then would plan attempt of LAD PCI. DOE could be from pulmonary HTN.   Cath: 09/28/17   Prox Cx to Mid Cx lesion is 25% stenosed.  Mid LAD lesion is 75% stenosed.  A drug-eluting stent was  successfully placed using a STENT RESOLUTE ONYX 2.0X26.  Post intervention, there is a 0% residual stenosis.  Ost 2nd Diag to 2nd Diag lesion is 75% stenosed.  Balloon angioplasty was performed using a BALLOON SAPPHIRE 2.0X12.  Post intervention, there is a 40% residual stenosis.   Recommend uninterrupted dual antiplatelet therapy with Aspirin 81mg  daily and Clopidogrel 75mg  dailyfor a minimum of 6 months (stable ischemic heart disease - Class I recommendation).  Continue aggressive secondary prevention. Continue therapy for heart failure.  TTE: 09/28/17  Study Conclusions  - Left ventricle: The cavity size was mildly dilated. There was moderate focal basal hypertrophy. Systolic function was severely reduced. The estimated ejection fraction was in the range of 25% to 30%. Wall motion was normal; there were no regional wall motion abnormalities. The study was not technically sufficient to allow evaluation of LV diastolic dysfunction due to atrial fibrillation. Doppler parameters are consistent with high ventricular filling pressure. - Aortic valve: There was mild to moderate regurgitation. Valve area (VTI): 4.04 cm^2. Valve area (Vmax): 3.14 cm^2. Valve area (Vmean): 3.02 cm^2. - Mitral valve: There was moderate to severe regurgitation likely related to annular dilatation, - Left atrium: The atrium was mildly dilated. - Right ventricle: The cavity size was mildly dilated. Wall thickness was normal. - Right atrium: The atrium was moderately dilated. - Tricuspid valve: There was mild regurgitation. - Pulmonic valve: There was mild regurgitation. - Pulmonary arteries: PA peak pressure: 56 mm Hg (S).  Impressions:  - The findings indicate significant septal-lateral left ventricular wall dyssynchrony. The right ventricular systolic pressure was increased consistent with moderate pulmonary hypertension.   Patient Profile     84 y.o. female  with hx of chronic systolic CHF, LBBB, HTN and abnormal stress test presented for cath. Underwent cath noted above.   Assessment & Plan    1. CAD - Cath showed 75% mLAD lesion. Unable to cross wire. Later developed large radial hematoma, now stable. Echo showed EF of 25-30% and decision made to attempt PCI via femoral access. Successful PCI/DES x1 to the LAD and balloon angioplasty to the 2nd diag. Plan for DAPT with ASA/plavix for at least 6 months. No further chest pain.  - Continue ASA, Plavix statin and BB  2. Chronic systolic CHF/ICM - Echo noted above with 25-30% with no WMA. Unable to give Losartan due to soft blood pressure. Continue Toprol now at 12.5mg  qd. Consider restart Losartan/lisinorpil at discharge or outpatient. Likely she will unable to hold Entresto. May consider spirolactone as  outpatient as well.   3. HTN - Diastolic BP remain soft. Continue BB.   4. HLD - Increased lipitor to 40. Check lipids in 6 weeks.   5. Hematoma - developed radial hematoma after first cath. Last evening with groin hematoma post sheath pull. Stable this morning but Hgb dropped from 12.2>>9.3>> today 8.1. Will recheck stat CBC. Hold SQ heparin >> start STDs.   6. NSVT:  - K and Mg stable.    Dispo: once hemoglobin is stable, will consider PT evaluation.   For questions or updates, please contact [facility] Please consult [email or web address] for contact info under     SignedManson Passey, PA  09/30/2017, 7:50 AM    Patient seen, examined. Available data reviewed. Agree with findings, assessment, and plan as outlined by Chelsea Aus, PA.  The patient is independently interviewed and examined.  She is an elderly woman in no distress.  Lung fields are clear, heart is regular rate and rhythm with no murmur gallop, abdomen is soft and nontender, extremities show no edema.  Right groin site has diffuse ecchymosis extending from the pubic area into the thigh and around the right flank.   All of this is soft with no firm hematoma.  Her hemoglobin has trended down under 8 mg/dL.  I agree that she should be treated with 1 unit of packed red blood cells.  She will have a CT scan of the abdomen and pelvis to rule out significant hematoma in the retroperitoneum or thigh.  We will continue to follow serial hemoglobins.  We will hold her antihypertensive medications for now as her blood pressure is soft.  Tonny Bollman, M.D. 09/30/2017 10:59 AM

## 2017-10-01 LAB — BPAM RBC
Blood Product Expiration Date: 201910242359
ISSUE DATE / TIME: 201909261335
Unit Type and Rh: 5100

## 2017-10-01 LAB — BASIC METABOLIC PANEL
Anion gap: 6 (ref 5–15)
BUN: 10 mg/dL (ref 8–23)
CO2: 26 mmol/L (ref 22–32)
Calcium: 9.5 mg/dL (ref 8.9–10.3)
Chloride: 102 mmol/L (ref 98–111)
Creatinine, Ser: 1.17 mg/dL — ABNORMAL HIGH (ref 0.44–1.00)
GFR calc Af Amer: 47 mL/min — ABNORMAL LOW (ref >60)
GFR calc non Af Amer: 41 mL/min — ABNORMAL LOW (ref >60)
Glucose, Bld: 111 mg/dL — ABNORMAL HIGH (ref 70–99)
Potassium: 3.6 mmol/L (ref 3.5–5.1)
Sodium: 134 mmol/L — ABNORMAL LOW (ref 135–145)

## 2017-10-01 LAB — TYPE AND SCREEN
ABO/RH(D): O POS
Antibody Screen: NEGATIVE
Unit division: 0

## 2017-10-01 LAB — CBC
HEMATOCRIT: 27.9 % — AB (ref 36.0–46.0)
HEMOGLOBIN: 9.5 g/dL — AB (ref 12.0–15.0)
MCH: 32.3 pg (ref 26.0–34.0)
MCHC: 34.1 g/dL (ref 30.0–36.0)
MCV: 94.9 fL (ref 78.0–100.0)
Platelets: 105 10*3/uL — ABNORMAL LOW (ref 150–400)
RBC: 2.94 MIL/uL — ABNORMAL LOW (ref 3.87–5.11)
RDW: 17.3 % — AB (ref 11.5–15.5)
WBC: 3.4 10*3/uL — ABNORMAL LOW (ref 4.0–10.5)

## 2017-10-01 MED ORDER — LACTULOSE 10 GM/15ML PO SOLN
20.0000 g | Freq: Once | ORAL | Status: AC
  Administered 2017-10-01: 13:00:00 20 g via ORAL
  Filled 2017-10-01: qty 30

## 2017-10-01 MED ORDER — POTASSIUM CHLORIDE ER 10 MEQ PO TBCR: 10.0000 meq | EXTENDED_RELEASE_TABLET | ORAL | 6 refills | Status: DC | PRN

## 2017-10-01 MED ORDER — ATORVASTATIN CALCIUM 40 MG PO TABS: 40.0000 mg | ORAL_TABLET | Freq: Every day | ORAL | 6 refills | Status: DC

## 2017-10-01 MED ORDER — METOPROLOL SUCCINATE ER 25 MG PO TB24
12.5000 mg | ORAL_TABLET | Freq: Every day | ORAL | Status: DC
  Administered 2017-10-01: 12.5 mg via ORAL
  Filled 2017-10-01: qty 1

## 2017-10-01 MED ORDER — CLOPIDOGREL BISULFATE 75 MG PO TABS: 75.0000 mg | ORAL_TABLET | Freq: Every day | ORAL | 6 refills | Status: DC

## 2017-10-01 MED ORDER — METOPROLOL SUCCINATE ER 25 MG PO TB24: 12.5000 mg | ORAL_TABLET | Freq: Every day | ORAL | 6 refills | Status: DC

## 2017-10-01 MED ORDER — POTASSIUM CHLORIDE CRYS ER 20 MEQ PO TBCR
30.0000 meq | EXTENDED_RELEASE_TABLET | Freq: Once | ORAL | Status: AC
  Administered 2017-10-01: 10:00:00 30 meq via ORAL
  Filled 2017-10-01: qty 1

## 2017-10-01 MED ORDER — FUROSEMIDE 40 MG PO TABS: 20.0000 mg | ORAL_TABLET | ORAL | Status: DC | PRN

## 2017-10-01 MED ORDER — BISACODYL 10 MG RE SUPP
10.0000 mg | Freq: Once | RECTAL | Status: AC
  Administered 2017-10-01: 13:00:00 10 mg via RECTAL
  Filled 2017-10-01: qty 1

## 2017-10-01 NOTE — Progress Notes (Addendum)
Progress Note  Patient Name: Danielle Bell Date of Encounter: 10/01/2017  Primary Cardiologist: Prentice Docker, MD   Subjective   No chest pain or dyspnea. Hgb stable after transfusion.   Inpatient Medications    Scheduled Meds: . aspirin EC  81 mg Oral Daily  . atorvastatin  40 mg Oral Daily  . clopidogrel  75 mg Oral Q breakfast  . fluticasone  1 spray Each Nare Daily  . levothyroxine  88 mcg Oral QAC breakfast  . pantoprazole  40 mg Oral Daily  . potassium chloride  30 mEq Oral Once  . sodium chloride flush  3 mL Intravenous Q12H  . sodium chloride flush  3 mL Intravenous Q12H   Continuous Infusions: . sodium chloride    . sodium chloride Stopped (09/30/17 1345)  . sodium chloride 999 mL/hr at 09/28/17 2117   PRN Meds: sodium chloride, sodium chloride, acetaminophen, HYDROcodone-acetaminophen, ondansetron (ZOFRAN) IV, polyethylene glycol, sodium chloride flush, sodium chloride flush, traMADol   Vital Signs    Vitals:   09/30/17 1915 09/30/17 2000 10/01/17 0348 10/01/17 0808  BP: (!) 121/36  (!) 122/46 (!) 109/43  Pulse: 94  98 92  Resp: 18 20 16 19   Temp: 98.1 F (36.7 C)  (!) 97.5 F (36.4 C) 98.1 F (36.7 C)  TempSrc: Oral  Oral Oral  SpO2: 93%  94% 100%  Weight:   69 kg   Height:        Intake/Output Summary (Last 24 hours) at 10/01/2017 0844 Last data filed at 10/01/2017 0348 Gross per 24 hour  Intake 825 ml  Output 2600 ml  Net -1775 ml   Filed Weights   09/29/17 0422 09/30/17 0228 10/01/17 0348  Weight: 68 kg 68.5 kg 69 kg    Telemetry    SR with intermittent PVCs - Personally Reviewed  ECG    N/A  Physical Exam   YNW:GNFA frail elderly female in no acute distress.   Neck:No JVD Cardiac:RRR, no murmurs, rubs, or gallops.  Respiratory:Clear to auscultation bilaterally. OZ:HYQM, nontender, non-distended  MS:No edema; No deformity. Extremity: Distal pedal pulses are 2+ bilaterally.Right radial/ femoral site extending to  back with significant bruising but no bruit noted. Areas are soft. Neuro:Nonfocal  Psych: Normal affect    Labs    Chemistry Recent Labs  Lab 09/29/17 0217 09/30/17 0754 10/01/17 0345  NA 131* 132* 134*  K 4.0 3.8 3.6  CL 101 103 102  CO2 19* 22 26  GLUCOSE 120* 111* 111*  BUN 11 12 10   CREATININE 1.07* 1.13* 1.17*  CALCIUM 9.5 9.5 9.5  GFRNONAA 45* 42* 41*  GFRAA 53* 49* 47*  ANIONGAP 11 7 6      Hematology Recent Labs  Lab 09/30/17 0754 09/30/17 1931 10/01/17 0345  WBC 4.7 4.4 3.4*  RBC 2.30* 2.90* 2.94*  HGB 7.7* 9.3* 9.5*  HCT 22.9* 27.4* 27.9*  MCV 99.6 94.5 94.9  MCH 33.5 32.1 32.3  MCHC 33.6 33.9 34.1  RDW 15.4 16.9* 17.3*  PLT 121* 117* 105*     Radiology    Ct Abdomen Pelvis Wo Contrast  Result Date: 09/30/2017 CLINICAL DATA:  Unexplained anemia. Clinical concern for retroperitoneal or thigh hematoma. EXAM: CT ABDOMEN AND PELVIS WITHOUT CONTRAST TECHNIQUE: Multidetector CT imaging of the abdomen and pelvis was performed following the standard protocol without IV contrast. COMPARISON:  05/22/2016. FINDINGS: Lower chest: Small right pleural effusion and small to moderate-sized left pleural effusion. Bilateral lower lobe atelectasis, greater on the left. Mildly  enlarged heart. Small pericardial effusion with a maximum thickness of 5 mm. Hepatobiliary: Multiple small gallstones in the dependent portion of the gallbladder measuring up to 3 mm in maximum diameter each. No gallbladder wall thickening or pericholecystic fluid. Unremarkable liver. Pancreas: Unremarkable. No pancreatic ductal dilatation or surrounding inflammatory changes. Spleen: Normal in size without focal abnormality. Adrenals/Urinary Tract: Normal appearing adrenal glands. Multiple bilateral renal cysts. Stable small left renal calculus. Normal appearing urinary bladder filled with contrast. Unremarkable ureters. No hydronephrosis. Stomach/Bowel: Unremarkable stomach, small bowel and colon.  Surgically absent appendix. Vascular/Lymphatic: Atheromatous arterial calcifications without aneurysm. These include the coronary arteries. No enlarged lymph nodes. Reproductive: Status post hysterectomy. No adnexal masses. Other: Bilateral subcutaneous edema, most pronounced in the right pelvis and bilateral vulvar regions. Small amount presacral medium density fluid measuring 31 Hounsfield units in density. Small amount of fluid in the right posterior pararenal space measuring 6 Hounsfield units in density. No retroperitoneal or proximal thigh hematoma seen. Musculoskeletal: Moderate dextroconvex lumbar scoliosis. Marked multilevel lumbar spine degenerative changes and mild degenerative changes in the lower thoracic spine. No fractures, pars defects or subluxations. IMPRESSION: 1. No retroperitoneal or proximal thigh hematoma seen. 2. Small amount of medium density presacral fluid. This could represent proteinaceous fluid or subacute hemorrhagic fluid. 3. Minimal simple fluid in the right posterior pararenal space. 4. Bilateral subcutaneous edema, most pronounced in the right pelvis and bilateral vulvar regions. 5. Cholelithiasis. 6. Small right pleural effusion and small to moderate-sized left pleural effusion. 7. Bilateral lower lobe atelectasis, greater on the left. 8. Small pericardial effusion. 9. Atheromatous calcifications, including the coronary arteries. 10. Stable small, nonobstructing left renal calculus. Electronically Signed   By: Beckie Salts M.D.   On: 09/30/2017 16:15    Cardiac Studies   Cath: 09/27/17   Prox RCA to Mid RCA lesion is 25% stenosed.  Mid LAD lesion is 75% stenosed. Attmepted PCI but unable to cross with a wire due to severe toruosity.  Prox Cx to Mid Cx lesion is 25% stenosed.  LV end diastolic pressure is mildly elevated.  There is no aortic valve stenosis.  Hemodynamic findings consistent with moderate pulmonary hypertension. Mean PA pressure 42 mm Hg.  CO 5.7  L/min; CI 3.3; PA 63/27, mean PCWP 42 mm Hg; Ao sat 95%, PA sat 72%  PLan for PCI attempt tomorrow from the right groin. Holding off on oral antiplatelet therapy until wire is across, in the event that PCI cannot be done and cardiac surgery consult is required.   Discussed with patient's family and Dr. Purvis Sheffield. Patient not having angina. WIll see if EF improved from July study. If so, would consider medical therapy for her CAD given the tortuosity. If there is an anterior wall motion abnormality, then would plan attempt of LAD PCI. DOE could be from pulmonary HTN.  Cath: 09/28/17   Prox Cx to Mid Cx lesion is 25% stenosed.  Mid LAD lesion is 75% stenosed.  A drug-eluting stent was successfully placed using a STENT RESOLUTE ONYX 2.0X26.  Post intervention, there is a 0% residual stenosis.  Ost 2nd Diag to 2nd Diag lesion is 75% stenosed.  Balloon angioplasty was performed using a BALLOON SAPPHIRE 2.0X12.  Post intervention, there is a 40% residual stenosis.   Recommend uninterrupted dual antiplatelet therapy with Aspirin 81mg  daily and Clopidogrel 75mg  dailyfor a minimum of 6 months (stable ischemic heart disease - Class I recommendation).  Continue aggressive secondary prevention. Continue therapy for heart failure.  TTE: 09/28/17  Study  Conclusions  - Left ventricle: The cavity size was mildly dilated. There was moderate focal basal hypertrophy. Systolic function was severely reduced. The estimated ejection fraction was in the range of 25% to 30%. Wall motion was normal; there were no regional wall motion abnormalities. The study was not technically sufficient to allow evaluation of LV diastolic dysfunction due to atrial fibrillation. Doppler parameters are consistent with high ventricular filling pressure. - Aortic valve: There was mild to moderate regurgitation. Valve area (VTI): 4.04 cm^2. Valve area (Vmax): 3.14 cm^2. Valve  area (Vmean): 3.02 cm^2. - Mitral valve: There was moderate to severe regurgitation likely related to annular dilatation, - Left atrium: The atrium was mildly dilated. - Right ventricle: The cavity size was mildly dilated. Wall thickness was normal. - Right atrium: The atrium was moderately dilated. - Tricuspid valve: There was mild regurgitation. - Pulmonic valve: There was mild regurgitation. - Pulmonary arteries: PA peak pressure: 56 mm Hg (S).  Impressions:  - The findings indicate significant septal-lateral left ventricular wall dyssynchrony. The right ventricular systolic pressure was increased consistent with moderate pulmonary hypertension.   Patient Profile     84 y.o.femalewith hx of chronic systolic CHF, LBBB, HTN and abnormal stress test presented for cath. Underwent cath noted above.  Assessment & Plan   1. CAD - Cath showed 75% mLAD lesion. Unable to cross wire. Later developed large radial hematoma, now stable.Echo showed EF of 25-30% and decision made to attempt PCI via femoral access. Successful PCI/DES x1 to the LAD and balloon angioplastyto the 2nd diag. Plan for DAPT with ASA/plavix for at least 6 months. No further chest pain.  - Continue ASA, Plavix and statin. BB held due to soft blood pressure yesterday however relatively stable after transfusion. Will try metoprolol tartrate 12.5mg  BID.   2. Chronic systolic CHF/ICM -Echo noted above with 25-30% with no WMA. Euvolemic. Continue BB  - Consider ACE/ARB/Entresto/spirolactone as outpatient.   3. HTN -Diastolic BP remain soft. Watch on BB.   4. HLD - Increasedlipitor to 40. Check lipids in 6 weeks.   5. Hematoma -Developed radial hematoma after first cath and roin hematoma post second cath. Hgb dropped to 7.7 now s/p 1 PRBCS transfusion. HGb stable today to 9.5. No retroperitoneal bleed on CT.   6. NSVT/PVCs - resume BB today.  Will give one dose of Kdur today.   Ambulate  with cardiac rehab. PT if needed. Discharge later today or tomorrow.    For questions or updates, please contact [facility] Please consult [email or web address] for contact info under       SignedManson Passey, PA  10/01/2017, 8:44 AM    Patient seen, examined. Available data reviewed. Agree with findings, assessment, and plan as outlined by Chelsea Aus, PA.  The patient is apparently interviewed and examined.  Heart is regular rate and rhythm with a 2/6 systolic murmur at the left sternal border and a widely split S2.  Lung fields are clear, JVP is normal, abdomen is soft and nontender, the right groin site is soft with improving ecchymotic area, there is no pretibial edema.  All available data is reviewed.  Patient's hemoglobin is stable after 1 unit of packed red blood cells.  She is asymptomatic.  Her CT scan is reviewed and there is no significant groin bleed or retroperitoneal hematoma.  I think she is stable for discharge today.  I agree with sending her home on a low-dose metoprolol succinate.  Her blood pressure has been in the low normal  range here and I think we should wait until she is stable as an outpatient before starting him back on Entresto.  She will have close clinical follow-up with Dr. Purvis Sheffield.  Tonny Bollman, M.D. 10/01/2017 10:35 AM

## 2017-10-01 NOTE — Progress Notes (Signed)
CARDIAC REHAB PHASE I   PRE:  Rate/Rhythm: 91 SR  BP:  Lying: 116/45     Sitting: 114/54    Standing: 108/63    SaO2: 98 RA  MODE:  Ambulation: 210 ft   123 peak HR  POST:  Rate/Rhythm: 107 SR with PVCs  BP:  Sitting: 83/45    SaO2: 95 RA   Pt ambulated 237ft assist of 1 with gait belt, front wheel walker and chair follow. Pt denies CP, SOB or dizziness. PT eval requested. Pt in recliner, daughter at bedside. Will continue to follow.  [id number] Reynold Bowen, RN BSN 10/01/2017 9:27 AM

## 2017-10-01 NOTE — Discharge Summary (Signed)
Discharge Summary    Patient ID: Danielle Bell MRN: [id number]; DOB: [birth year]  Admit date: 09/27/2017 Discharge date: 10/01/2017  Primary Care Provider: Kirstie Peri, MD  Primary Cardiologist: Prentice Docker, MD   Discharge Diagnoses    Active Problems:   Abnormal stress test   CAD in native artery   Chronic systolic CHF  ICM  HTN  HLD Hematoma  PVCs NSVT  Allergies Allergies  Allergen Reactions  . Asa [Aspirin]     Epigastric discomfort; hives (Able to tolerate in low dose)  . Ciprofloxacin     Headache   . Hctz [Hydrochlorothiazide]     Possible high calcium   . Lescol Xl [Fluvastatin]     Reflux   . Sulfa Antibiotics     abd upset     Diagnostic Studies/Procedures    Cath: 09/27/17   Prox RCA to Mid RCA lesion is 25% stenosed.  Mid LAD lesion is 75% stenosed. Attmepted PCI but unable to cross with a wire due to severe toruosity.  Prox Cx to Mid Cx lesion is 25% stenosed.  LV end diastolic pressure is mildly elevated.  There is no aortic valve stenosis.  Hemodynamic findings consistent with moderate pulmonary hypertension. Mean PA pressure 42 mm Hg.  CO 5.7 L/min; CI 3.3; PA 63/27, mean PCWP 42 mm Hg; Ao sat 95%, PA sat 72%  PLan for PCI attempt tomorrow from the right groin. Holding off on oral antiplatelet therapy until wire is across, in the event that PCI cannot be done and cardiac surgery consult is required.   Discussed with patient's family and Dr. Purvis Sheffield. Patient not having angina. WIll see if EF improved from July study. If so, would consider medical therapy for her CAD given the tortuosity. If there is an anterior wall motion abnormality, then would plan attempt of LAD PCI. DOE could be from pulmonary HTN.  Cath: 09/28/17   Prox Cx to Mid Cx lesion is 25% stenosed.  Mid LAD lesion is 75% stenosed.  A drug-eluting stent was successfully placed using a STENT RESOLUTE ONYX 2.0X26.  Post intervention, there is a 0%  residual stenosis.  Ost 2nd Diag to 2nd Diag lesion is 75% stenosed.  Balloon angioplasty was performed using a BALLOON SAPPHIRE 2.0X12.  Post intervention, there is a 40% residual stenosis.   Recommend uninterrupted dual antiplatelet therapy with Aspirin 81mg  daily and Clopidogrel 75mg  dailyfor a minimum of 6 months (stable ischemic heart disease - Class I recommendation).  Continue aggressive secondary prevention. Continue therapy for heart failure.  TTE: 09/28/17  Study Conclusions  - Left ventricle: The cavity size was mildly dilated. There was moderate focal basal hypertrophy. Systolic function was severely reduced. The estimated ejection fraction was in the range of 25% to 30%. Wall motion was normal; there were no regional wall motion abnormalities. The study was not technically sufficient to allow evaluation of LV diastolic dysfunction due to atrial fibrillation. Doppler parameters are consistent with high ventricular filling pressure. - Aortic valve: There was mild to moderate regurgitation. Valve area (VTI): 4.04 cm^2. Valve area (Vmax): 3.14 cm^2. Valve area (Vmean): 3.02 cm^2. - Mitral valve: There was moderate to severe regurgitation likely related to annular dilatation, - Left atrium: The atrium was mildly dilated. - Right ventricle: The cavity size was mildly dilated. Wall thickness was normal. - Right atrium: The atrium was moderately dilated. - Tricuspid valve: There was mild regurgitation. - Pulmonic valve: There was mild regurgitation. - Pulmonary arteries: PA peak pressure: 56 mm Hg (  S).  Impressions:  - The findings indicate significant septal-lateral left ventricular wall dyssynchrony. The right ventricular systolic pressure was increased consistent with moderate pulmonary hypertension.   History of Present Illness     84 y.o.femalewith hx of chronic systolic CHF, LBBB, HTN and abnormal stress test presented  for cath.  Hospital Course     Consultants: None  1. CAD - Cath showed 75% mLAD lesion. Unable to cross wire. Later developed large radial hematoma, now stable.Echo showed EF of 25-30% and decision made to attempt PCI via femoral access. Successful PCI/DES x1 to the LAD and balloon angioplastyto the 2nd diag. Plan for DAPT with ASA/plavix for at least 6 months. No further chest pain.  - Continue ASA,Plavixand statin. BB held due to soft blood pressure yesterday however relatively stable after transfusion. Will continue low dose BB. Ambulated well without chest pain. PT recommended HH.   2. Chronic systolic CHF/ICM -Echo noted above with 25-30% with no WMA.Euvolemic. Continue BB  - Consider ACE/ARB/Entresto/spirolactone as outpatient.   3. HTN -Diastolic BP remained soft. Watch on BB.   4. HLD -Increasedlipitor to 40. Check lipids in 6 weeks.   5. Hematoma -Developed radial hematoma after first cath and groin hematoma post second cath. Hgb dropped to 7.7 s/p 1 PRBCS transfusion with improved hemoglobin. No retroperitoneal bleed on CT.   6. NSVT/PVCs - Continue BB.   Discharge Vitals Blood pressure (!) 105/46, pulse 84, temperature 97.9 F (36.6 C), resp. rate (!) 24, height 5\' 3"  (1.6 m), weight 69 kg, SpO2 98 %.  Filed Weights   09/29/17 0422 09/30/17 0228 10/01/17 0348  Weight: 68 kg 68.5 kg 69 kg    Labs & Radiologic Studies    CBC Recent Labs    09/30/17 1931 10/01/17 0345  WBC 4.4 3.4*  HGB 9.3* 9.5*  HCT 27.4* 27.9*  MCV 94.5 94.9  PLT 117* 105*   Basic Metabolic Panel Recent Labs    53/66/44 0217 09/30/17 0252 09/30/17 0754 10/01/17 0345  NA 131*  --  132* 134*  K 4.0  --  3.8 3.6  CL 101  --  103 102  CO2 19*  --  22 26  GLUCOSE 120*  --  111* 111*  BUN 11  --  12 10  CREATININE 1.07*  --  1.13* 1.17*  CALCIUM 9.5  --  9.5 9.5  MG 1.7 2.1  --   --    L_____________  Ct Abdomen Pelvis Wo Contrast  Result Date: 09/30/2017 CLINICAL  DATA:  Unexplained anemia. Clinical concern for retroperitoneal or thigh hematoma. EXAM: CT ABDOMEN AND PELVIS WITHOUT CONTRAST TECHNIQUE: Multidetector CT imaging of the abdomen and pelvis was performed following the standard protocol without IV contrast. COMPARISON:  05/22/2016. FINDINGS: Lower chest: Small right pleural effusion and small to moderate-sized left pleural effusion. Bilateral lower lobe atelectasis, greater on the left. Mildly enlarged heart. Small pericardial effusion with a maximum thickness of 5 mm. Hepatobiliary: Multiple small gallstones in the dependent portion of the gallbladder measuring up to 3 mm in maximum diameter each. No gallbladder wall thickening or pericholecystic fluid. Unremarkable liver. Pancreas: Unremarkable. No pancreatic ductal dilatation or surrounding inflammatory changes. Spleen: Normal in size without focal abnormality. Adrenals/Urinary Tract: Normal appearing adrenal glands. Multiple bilateral renal cysts. Stable small left renal calculus. Normal appearing urinary bladder filled with contrast. Unremarkable ureters. No hydronephrosis. Stomach/Bowel: Unremarkable stomach, small bowel and colon. Surgically absent appendix. Vascular/Lymphatic: Atheromatous arterial calcifications without aneurysm. These include the coronary arteries. No  enlarged lymph nodes. Reproductive: Status post hysterectomy. No adnexal masses. Other: Bilateral subcutaneous edema, most pronounced in the right pelvis and bilateral vulvar regions. Small amount presacral medium density fluid measuring 31 Hounsfield units in density. Small amount of fluid in the right posterior pararenal space measuring 6 Hounsfield units in density. No retroperitoneal or proximal thigh hematoma seen. Musculoskeletal: Moderate dextroconvex lumbar scoliosis. Marked multilevel lumbar spine degenerative changes and mild degenerative changes in the lower thoracic spine. No fractures, pars defects or subluxations. IMPRESSION: 1.  No retroperitoneal or proximal thigh hematoma seen. 2. Small amount of medium density presacral fluid. This could represent proteinaceous fluid or subacute hemorrhagic fluid. 3. Minimal simple fluid in the right posterior pararenal space. 4. Bilateral subcutaneous edema, most pronounced in the right pelvis and bilateral vulvar regions. 5. Cholelithiasis. 6. Small right pleural effusion and small to moderate-sized left pleural effusion. 7. Bilateral lower lobe atelectasis, greater on the left. 8. Small pericardial effusion. 9. Atheromatous calcifications, including the coronary arteries. 10. Stable small, nonobstructing left renal calculus. Electronically Signed   By: Beckie Salts M.D.   On: 09/30/2017 16:15   Disposition   Pt is being discharged home today in good condition.  Follow-up Plans & Appointments    Follow-up Information    Laqueta Linden, MD. Go on 10/08/2017.   Specialty:  Cardiology Why:  @10 :20am for TOC follow up  Contact information: [address] [place] [address] [phone]        [facility] Follow up.   Specialty:  Home Health Services Why:  HHPT Contact information: [address] [place] [address] [phone]          Discharge Instructions    AMB Referral to Cardiac Rehabilitation - Phase II   Complete by:  As directed    Diagnosis:  Coronary Stents   Diet - low sodium heart healthy   Complete by:  As directed    Discharge instructions   Complete by:  As directed    No driving until seen in clinic.  No lifting over 5 lbs for 1 week.  Keep procedure site clean & dry. If you notice increased pain, swelling, bleeding or pus, call/return!  You may shower, but no soaking baths/hot tubs/pools for 1 week.   *Weigh yourself on the same scale at same time of day and keep a log. *Report weight gain of > 3 lbs in 1 day or 5 lbs over the course of a week and/or symptoms of excess fluid (shortness of breath, difficulty  lying flat, swelling, poor appetite, abdominal fullness/bloating, etc) to your doctor immediately. *Avoid foods that are high in sodium (processed, pre-packaged/canned goods, fast foods, etc). *Please attend all scheduled and reccommended follow up appointments   Increase activity slowly   Complete by:  As directed       Discharge Medications   Allergies as of 10/01/2017      Reactions   Asa [aspirin]    Epigastric discomfort; hives (Able to tolerate in low dose)   Ciprofloxacin    Headache    Hctz [hydrochlorothiazide]    Possible high calcium    Lescol Xl [fluvastatin]    Reflux    Sulfa Antibiotics    abd upset       Medication List    TAKE these medications   aspirin EC 81 MG tablet Take 1 tablet (81 mg total) by mouth daily. What changed:  when to take this   atorvastatin 40 MG tablet  Commonly known as:  LIPITOR Take 1 tablet (40 mg total) by mouth daily. What changed:    medication strength  how much to take   cholecalciferol 1000 units tablet Commonly known as:  VITAMIN D Take 2,000 Units by mouth daily.   clopidogrel 75 MG tablet Commonly known as:  PLAVIX Take 1 tablet (75 mg total) by mouth daily with breakfast. Start taking on:  10/02/2017   fluticasone 50 MCG/ACT nasal spray Commonly known as:  FLONASE Place 1 spray into both nostrils daily.   furosemide 40 MG tablet Commonly known as:  LASIX Take 0.5 tablets (20 mg total) by mouth as needed. For edema or shortness of breath What changed:    how much to take  when to take this  reasons to take this  additional instructions   levothyroxine 88 MCG tablet Commonly known as:  SYNTHROID, LEVOTHROID Take 88 mcg by mouth daily before breakfast.   metoprolol succinate 25 MG 24 hr tablet Commonly known as:  TOPROL-XL Take 0.5 tablets (12.5 mg total) by mouth daily. What changed:  when to take this   MIRALAX PO Take 17 g by mouth daily as needed (constipation).   pantoprazole 40 MG  tablet Commonly known as:  PROTONIX Take 40 mg by mouth daily.   potassium chloride 10 MEQ tablet Commonly known as:  K-DUR Take 1 tablet (10 mEq total) by mouth as needed. When you take lasix. What changed:    when to take this  reasons to take this  additional instructions   traMADol 50 MG tablet Commonly known as:  ULTRAM Take 50 mg by mouth every 6 (six) hours as needed for moderate pain.        Acute coronary syndrome (MI, NSTEMI, STEMI, etc) this admission?: No.    Outstanding Labs/Studies   CBC during follow up Lipid panel and LFTs in 6 weeks.   Duration of Discharge Encounter   Greater than 30 minutes including physician time.  Lorelei Pont, PA 10/01/2017, 2:49 PM

## 2017-10-01 NOTE — Care Management Note (Signed)
Case Management Note  Patient Details  Name: Danielle Bell MRN: [id number] Date of Birth: 02-24-1927  Subjective/Objective:  From home, s/p stent intervention, per pt eval rec HHPT, NCM offered choice, daughter chose [facility], referral given to [facility] with [facility] , soc will begin 24-48 hrs post dc.                   Action/Plan: DC home when ready, will need HHPT orders with face to face prior to dc.  Expected Discharge Date:                  Expected Discharge Plan:  Home w Home Health Services  In-House Referral:  NA  Discharge planning Services  CM Consult  Post Acute Care Choice:  Home Health Choice offered to:  Adult Children  DME Arranged:    DME Agency:     HH Arranged:  PT HH Agency:  [facility]  Status of Service:  Completed, signed off  If discussed at Long Length of Stay Meetings, dates discussed:    Additional Comments:  Leone Haven, RN 10/01/2017, 12:48 PM

## 2017-10-01 NOTE — Evaluation (Signed)
Physical Therapy Evaluation Patient Details Name: Danielle Bell MRN: [id number] DOB: Apr 24, 1927 Today's Date: 10/01/2017   History of Present Illness  Pt is an 84 y/o female s/p R heart cath and PCI intervention secondary to abnormal stress test. PMH includes CAD, HTN, and CHF.   Clinical Impression  Pt admitted secondary to problem above with deficits below. Pt asymptomatic throughout gait and requiring min to min guard A for mobility tasks using RW. Pt reports son available to assist at home and is interested in [facility] services following d/c. Will continue to follow acutely to maximize functional mobility independence and safety.     Follow Up Recommendations Home health PT;Other (comment);Supervision for mobility/OOB(HHOT )    Equipment Recommendations  None recommended by PT    Recommendations for Other Services       Precautions / Restrictions Precautions Precautions: Fall Restrictions Weight Bearing Restrictions: Yes RUE Weight Bearing: Partial weight bearing RUE Partial Weight Bearing Percentage or Pounds: 5 lbs      Mobility  Bed Mobility               General bed mobility comments: In chair upon entry.   Transfers Overall transfer level: Needs assistance Equipment used: Rolling walker (2 wheeled) Transfers: Sit to/from Stand Sit to Stand: Min assist         General transfer comment: Light min A for lift assist and steadying. Verbal cues to scoot to edge of chair before standing and for safe hand placement.   Ambulation/Gait Ambulation/Gait assistance: Min guard Gait Distance (Feet): 120 Feet Assistive device: Rolling walker (2 wheeled) Gait Pattern/deviations: Step-through pattern;Decreased stride length Gait velocity: Decreased    General Gait Details: Slow, cautious gait, however, overall steady using RW. Educated about how to adjust rollator at home as pt reports her arms are getting tired while using it. Verbal cues for upright posture.    Stairs Stairs: Yes       General stair comments: Verbally educated about safe stair navigation.   Wheelchair Mobility    Modified Rankin (Stroke Patients Only)       Balance Overall balance assessment: Needs assistance Sitting-balance support: No upper extremity supported;Feet supported Sitting balance-Leahy Scale: Good     Standing balance support: Bilateral upper extremity supported;During functional activity Standing balance-Leahy Scale: Poor Standing balance comment: Reliant on BUE support                              Pertinent Vitals/Pain Pain Assessment: No/denies pain    Home Living Family/patient expects to be discharged to:: Private residence Living Arrangements: Children Available Help at Discharge: Family;Available 24 hours/day Type of Home: House Home Access: Stairs to enter Entrance Stairs-Rails: Right Entrance Stairs-Number of Steps: 3-4 Home Layout: One level Home Equipment: Walker - 4 wheels;Shower seat      Prior Function Level of Independence: Independent with assistive device(s)         Comments: USed rollator for ambulation      Hand Dominance        Extremity/Trunk Assessment   Upper Extremity Assessment Upper Extremity Assessment: RUE deficits/detail RUE Deficits / Details: RUE bruising/hematoma at cath site. Pt reports no pain.     Lower Extremity Assessment Lower Extremity Assessment: RLE deficits/detail RLE Deficits / Details: R groin hematoma/bruising at cath site. Pt reports no pain.     Cervical / Trunk Assessment Cervical / Trunk Assessment: Kyphotic  Communication   Communication: No difficulties  Cognition Arousal/Alertness: Awake/alert Behavior During Therapy: WFL for tasks assessed/performed Overall Cognitive Status: Within Functional Limits for tasks assessed                                        General Comments General comments (skin integrity, edema, etc.): Pt's daughter  present during session     Exercises     Assessment/Plan    PT Assessment Patient needs continued PT services  PT Problem List Decreased strength;Decreased balance;Decreased mobility;Decreased knowledge of use of DME;Decreased knowledge of precautions       PT Treatment Interventions DME instruction;Gait training;Functional mobility training;Stair training;Therapeutic activities;Therapeutic exercise;Balance training;Patient/family education    PT Goals (Current goals can be found in the Care Plan section)  Acute Rehab PT Goals Patient Stated Goal: to go home today  PT Goal Formulation: With patient Time For Goal Achievement: 10/15/17 Potential to Achieve Goals: Good    Frequency Min 3X/week   Barriers to discharge        Co-evaluation               AM-PAC PT "6 Clicks" Daily Activity  Outcome Measure Difficulty turning over in bed (including adjusting bedclothes, sheets and blankets)?: A Little Difficulty moving from lying on back to sitting on the side of the bed? : Unable Difficulty sitting down on and standing up from a chair with arms (e.g., wheelchair, bedside commode, etc,.)?: Unable Help needed moving to and from a bed to chair (including a wheelchair)?: A Little Help needed walking in hospital room?: A Little Help needed climbing 3-5 steps with a railing? : A Lot 6 Click Score: 13    End of Session Equipment Utilized During Treatment: Gait belt Activity Tolerance: Patient tolerated treatment well Patient left: in chair;with call bell/phone within reach;with family/visitor present Nurse Communication: Mobility status PT Visit Diagnosis: Muscle weakness (generalized) (M62.81);Other abnormalities of gait and mobility (R26.89)    Time: 1610-9604 PT Time Calculation (min) (ACUTE ONLY): 18 min   Charges:   PT Evaluation $PT Eval Low Complexity: 1 Low          Gladys Damme, PT, DPT  Acute Rehabilitation Services  Pager: [phone] Office:  [phone]   Lehman Prom 10/01/2017, 12:03 PM

## 2017-10-04 ENCOUNTER — Telehealth: Payer: Self-pay

## 2017-10-04 DIAGNOSIS — Z955 Presence of coronary angioplasty implant and graft: Secondary | ICD-10-CM | POA: Diagnosis not present

## 2017-10-04 DIAGNOSIS — I255 Ischemic cardiomyopathy: Secondary | ICD-10-CM | POA: Diagnosis not present

## 2017-10-04 DIAGNOSIS — I472 Ventricular tachycardia: Secondary | ICD-10-CM | POA: Diagnosis not present

## 2017-10-04 DIAGNOSIS — Z48812 Encounter for surgical aftercare following surgery on the circulatory system: Secondary | ICD-10-CM | POA: Diagnosis not present

## 2017-10-04 DIAGNOSIS — I2510 Atherosclerotic heart disease of native coronary artery without angina pectoris: Secondary | ICD-10-CM | POA: Diagnosis not present

## 2017-10-04 DIAGNOSIS — I110 Hypertensive heart disease with heart failure: Secondary | ICD-10-CM | POA: Diagnosis not present

## 2017-10-04 DIAGNOSIS — I5022 Chronic systolic (congestive) heart failure: Secondary | ICD-10-CM | POA: Diagnosis not present

## 2017-10-04 DIAGNOSIS — I447 Left bundle-branch block, unspecified: Secondary | ICD-10-CM | POA: Diagnosis not present

## 2017-10-04 NOTE — Telephone Encounter (Signed)
  Patient contacted regarding discharge from [facility] on 10/01/17.  Patient understands to follow up with provider koneswaran on 10/08/17 at 10:20 at [facility]. Patient understands discharge instructions? yes Patient understands medications and regiment? yes   I gave her our phone number

## 2017-10-06 DIAGNOSIS — Z48812 Encounter for surgical aftercare following surgery on the circulatory system: Secondary | ICD-10-CM | POA: Diagnosis not present

## 2017-10-06 DIAGNOSIS — I2510 Atherosclerotic heart disease of native coronary artery without angina pectoris: Secondary | ICD-10-CM | POA: Diagnosis not present

## 2017-10-06 DIAGNOSIS — I110 Hypertensive heart disease with heart failure: Secondary | ICD-10-CM | POA: Diagnosis not present

## 2017-10-06 DIAGNOSIS — I447 Left bundle-branch block, unspecified: Secondary | ICD-10-CM | POA: Diagnosis not present

## 2017-10-06 DIAGNOSIS — I5022 Chronic systolic (congestive) heart failure: Secondary | ICD-10-CM | POA: Diagnosis not present

## 2017-10-06 DIAGNOSIS — I255 Ischemic cardiomyopathy: Secondary | ICD-10-CM | POA: Diagnosis not present

## 2017-10-08 ENCOUNTER — Ambulatory Visit (INDEPENDENT_AMBULATORY_CARE_PROVIDER_SITE_OTHER): Payer: Medicare Other | Admitting: Cardiovascular Disease

## 2017-10-08 ENCOUNTER — Encounter: Payer: Self-pay | Admitting: Cardiovascular Disease

## 2017-10-08 VITALS — BP 104/56 | HR 64 | Ht 63.0 in | Wt 148.0 lb

## 2017-10-08 DIAGNOSIS — Z9289 Personal history of other medical treatment: Secondary | ICD-10-CM | POA: Diagnosis not present

## 2017-10-08 DIAGNOSIS — E785 Hyperlipidemia, unspecified: Secondary | ICD-10-CM

## 2017-10-08 DIAGNOSIS — Z955 Presence of coronary angioplasty implant and graft: Secondary | ICD-10-CM

## 2017-10-08 DIAGNOSIS — I428 Other cardiomyopathies: Secondary | ICD-10-CM

## 2017-10-08 DIAGNOSIS — I10 Essential (primary) hypertension: Secondary | ICD-10-CM | POA: Diagnosis not present

## 2017-10-08 DIAGNOSIS — Z713 Dietary counseling and surveillance: Secondary | ICD-10-CM

## 2017-10-08 DIAGNOSIS — I5022 Chronic systolic (congestive) heart failure: Secondary | ICD-10-CM

## 2017-10-08 DIAGNOSIS — I25118 Atherosclerotic heart disease of native coronary artery with other forms of angina pectoris: Secondary | ICD-10-CM | POA: Diagnosis not present

## 2017-10-08 MED ORDER — FUROSEMIDE 20 MG PO TABS: 20.0000 mg | ORAL_TABLET | Freq: Every day | ORAL | 3 refills | Status: AC

## 2017-10-08 MED ORDER — POTASSIUM CHLORIDE ER 10 MEQ PO TBCR: 10.0000 meq | EXTENDED_RELEASE_TABLET | Freq: Every day | ORAL | 3 refills | Status: AC

## 2017-10-08 NOTE — Patient Instructions (Addendum)
Medication Instructions:  Take Lasix 20 mg daily  Take Potassium 10 meq daily   If you need a refill on your cardiac medications before your next appointment, please call your pharmacy.   Lab work: NONE If you have labs (blood work) drawn today and your tests are completely normal, you will receive your results only by: Marland Kitchen MyChart Message (if you have MyChart) OR . A paper copy in the mail If you have any lab test that is abnormal or we need to change your treatment, we will call you to review the results.  Testing/Procedures: NONE  Follow-Up: At [facility], you and your health needs are our priority.  As part of our continuing mission to provide you with exceptional heart care, we have created designated Provider Care Teams.  These Care Teams include your primary Cardiologist (physician) and Advanced Practice Providers (APPs -  Physician Assistants and Nurse Practitioners) who all work together to provide you with the care you need, when you need it. . 3 months with Physicians Assistant  Any Other Special Instructions Will Be Listed Below (If Applicable). You have been referred to nutrition, they will call you to make apt      Thank you for choosing [facility] !

## 2017-10-08 NOTE — Progress Notes (Signed)
SUBJECTIVE: The patient presents for a transition of care appointment.  She initially underwent coronary angiography on 09/27/2017 which showed a mid LAD 75% stenosis.  PCI was attempted but the lesion could not be crossed with a wire due to severe tortuosity.  She underwent an echocardiogram on 09/28/2017 which showed severely reduced left ventricular systolic function, LVEF 25 to 16%, high ventricular filling pressures, mild to moderate aortic regurgitation, moderate to severe mitral regurgitation related to annular dilatation, and mild tricuspid and pulmonic regurgitation.  There was biatrial enlargement.  There was moderate pulmonary hypertension.  She ultimately underwent drug-eluting stent placement to the mid LAD.  She also underwent balloon angioplasty for a 75% second diagonal lesion which was reduced to 40%.  She developed a radial hematoma after the first cardiac catheterization and a groin hematoma after the second cardiac catheterization.  Hemoglobin dropped to 7.7 and then received 1 unit of packed red blood cells.  There was no retroperitoneal bleed seen on CT.  She is here with her son, Alinda Money.  She weighs herself daily and said she weighed 145 pounds this morning.  She has not noticed a marked increases in weight.  She receives home physical therapy and was told by her therapist that she has gradually improved each time he visits with her.  She denies exertional chest pain.  She had not been short of breath until this morning.  She said she was hurried and they were running late.  She denies orthopnea paroxysmal nocturnal dyspnea.  Her son has a lot of questions about how to cook for her and how to shop for her as he says his mother is very particular about adhering to a low-sodium diet.  He asked about boxed mashed potatoes, frozen chicken breasts, and fresh vegetables.  He said it is very difficult cooking for her as she does not want to eat foods he makes.  She has some mild  right forearm discomfort and mild right groin discomfort.     Review of Systems: As per "subjective", otherwise negative.  Allergies  Allergen Reactions  . Asa [Aspirin]     Epigastric discomfort; hives (Able to tolerate in low dose)  . Ciprofloxacin     Headache   . Hctz [Hydrochlorothiazide]     Possible high calcium   . Lescol Xl [Fluvastatin]     Reflux   . Sulfa Antibiotics     abd upset     Current Outpatient Medications  Medication Sig Dispense Refill  . aspirin EC 81 MG tablet Take 1 tablet (81 mg total) by mouth daily. (Patient taking differently: Take 81 mg by mouth at bedtime. )    . atorvastatin (LIPITOR) 40 MG tablet Take 1 tablet (40 mg total) by mouth daily. 30 tablet 6  . cholecalciferol (VITAMIN D) 1000 units tablet Take 2,000 Units by mouth daily.    . clopidogrel (PLAVIX) 75 MG tablet Take 1 tablet (75 mg total) by mouth daily with breakfast. 30 tablet 6  . fluticasone (FLONASE) 50 MCG/ACT nasal spray Place 1 spray into both nostrils daily.    . furosemide (LASIX) 40 MG tablet Take 0.5 tablets (20 mg total) by mouth as needed. For edema or shortness of breath 33 tablet   . levothyroxine (SYNTHROID, LEVOTHROID) 88 MCG tablet Take 88 mcg by mouth daily before breakfast.    . metoprolol succinate (TOPROL-XL) 25 MG 24 hr tablet Take 0.5 tablets (12.5 mg total) by mouth daily. 30 tablet 6  .  pantoprazole (PROTONIX) 40 MG tablet Take 40 mg by mouth daily.    . Polyethylene Glycol 3350 (MIRALAX PO) Take 17 g by mouth daily as needed (constipation).     . potassium chloride (K-DUR) 10 MEQ tablet Take 1 tablet (10 mEq total) by mouth as needed. When you take lasix. 30 tablet 6  . traMADol (ULTRAM) 50 MG tablet Take 50 mg by mouth every 6 (six) hours as needed for moderate pain.      No current facility-administered medications for this visit.     Past Medical History:  Diagnosis Date  . Actinic keratosis   . Arthritis    "qwhere" (09/28/2017)  . Chronic back pain     "mostly lower" (09/28/2017)  . Coronary artery disease   . Degenerative joint disease   . Gout   . Hearing loss   . Hearing loss   . High cholesterol   . Hypercalcemia   . Hypertension   . Hypothyroidism   . Keratosis, seborrheic   . LBBB (left bundle branch block)   . Macrocytosis   . Myocardial infarction (HCC) 07/2017  . Premature beats   . Primary ovarian failure   . Renal insufficiency syndrome   . Shoulder arthritis   . Tachycardia   . Vitamin D deficiency     Past Surgical History:  Procedure Laterality Date  . ABDOMINAL HYSTERECTOMY  1970s  . APPENDECTOMY    . CARDIAC CATHETERIZATION  09/27/2017   "unsuccessful for stent"  . CATARACT EXTRACTION, BILATERAL Bilateral   . COLONOSCOPY    . CORONARY ANGIOPLASTY WITH STENT PLACEMENT  09/28/2017   "1 stent" (09/28/2017)  . CORONARY STENT INTERVENTION N/A 09/28/2017   Procedure: CORONARY STENT INTERVENTION;  Surgeon: Corky Crafts, MD;  Location: [facility] INVASIVE CV LAB;  Service: Cardiovascular;  Laterality: N/A;  . RIGHT/LEFT HEART CATH AND CORONARY ANGIOGRAPHY N/A 09/27/2017   Procedure: RIGHT/LEFT HEART CATH AND CORONARY ANGIOGRAPHY;  Surgeon: Corky Crafts, MD;  Location: [facility] INVASIVE CV LAB;  Service: Cardiovascular;  Laterality: N/A;    Social History   Socioeconomic History  . Marital status: Widowed    Spouse name: Not on file  . Number of children: Not on file  . Years of education: Not on file  . Highest education level: Not on file  Occupational History  . Not on file  Social Needs  . Financial resource strain: Not on file  . Food insecurity:    Worry: Not on file    Inability: Not on file  . Transportation needs:    Medical: Not on file    Non-medical: Not on file  Tobacco Use  . Smoking status: Never Smoker  . Smokeless tobacco: Never Used  Substance and Sexual Activity  . Alcohol use: Never    Frequency: Never  . Drug use: Never  . Sexual activity: Not on file  Lifestyle  .  Physical activity:    Days per week: Not on file    Minutes per session: Not on file  . Stress: Not on file  Relationships  . Social connections:    Talks on phone: Not on file    Gets together: Not on file    Attends religious service: Not on file    Active member of club or organization: Not on file    Attends meetings of clubs or organizations: Not on file    Relationship status: Not on file  . Intimate partner violence:    Fear of current or ex  partner: Not on file    Emotionally abused: Not on file    Physically abused: Not on file    Forced sexual activity: Not on file  Other Topics Concern  . Not on file  Social History Narrative  . Not on file     Vitals:   10/08/17 1010  BP: (!) 104/56  Pulse: 64  SpO2: 90%  Weight: 148 lb (67.1 kg)  Height: 5\' 3"  (1.6 m)    Wt Readings from Last 3 Encounters:  10/08/17 148 lb (67.1 kg)  10/01/17 152 lb 1.9 oz (69 kg)  09/22/17 153 lb (69.4 kg)     PHYSICAL EXAM General: NAD HEENT: Normal. Neck: No JVD, no thyromegaly. Lungs: Diminished sounds at bases but no obvious crackles or wheezes. CV: Regular rate and rhythm, normal S1/paradoxically split S2, no S3/S4, no murmur. No pretibial or periankle edema.  Stasis dermatitis bilaterally. Abdomen: Soft, nontender, no distention.  Neurologic: Alert and oriented.  Psych: Normal affect. Skin: Ecchymoses over right forearm and right pretibial and peri-ankle region. Musculoskeletal: No gross deformities.    ECG: Reviewed above under Subjective   Labs: Lab Results  Component Value Date/Time   K 3.6 10/01/2017 03:45 AM   BUN 10 10/01/2017 03:45 AM   BUN 13 08/16/2017 08:53 AM   CREATININE 1.17 (H) 10/01/2017 03:45 AM   CREATININE 1.08 (H) 09/23/2017 01:54 PM   HGB 9.5 (L) 10/01/2017 03:45 AM     Lipids: No results found for: LDLCALC, LDLDIRECT, CHOL, TRIG, HDL     ASSESSMENT AND PLAN: 1.  Coronary artery disease: Symptomatically stable.  Continue aspirin 81 mg,  atorvastatin 40 mg, Plavix 75 mg, and Toprol-XL 12.5 mg.  2.  Cardiomyopathy/chronic systolic heart failure: Euvolemic.  LVEF 25 to 30%.  Continue Toprol-XL 12.5 mg.  She previously did not tolerate Entresto due to symptomatic hypotension.  Blood pressures are soft today.  I will have her take Lasix 20 mg daily along with supplemental potassium. If blood pressure increases over time, I may be able to reintroduce an ACE inhibitor or angiotensin receptor blocker at a very low dose. I will repeat an echocardiogram if medical therapy is able to be optimized for several months. I will also make a nutrition consult so that both the patient and family members can discuss how to cook and prepare low-sodium foods in general.  3.  Hypertension: Blood pressure is low normal.  I will monitor as I am having her take Lasix on a daily basis.  4.  Hyperlipidemia: Continue atorvastatin 40 mg.    Disposition: Follow up 3 months   Prentice Docker, M.D., F.A.C.C.

## 2017-10-12 ENCOUNTER — Telehealth: Payer: Self-pay | Admitting: Cardiovascular Disease

## 2017-10-12 DIAGNOSIS — Z299 Encounter for prophylactic measures, unspecified: Secondary | ICD-10-CM | POA: Diagnosis not present

## 2017-10-12 DIAGNOSIS — I447 Left bundle-branch block, unspecified: Secondary | ICD-10-CM | POA: Diagnosis not present

## 2017-10-12 DIAGNOSIS — I110 Hypertensive heart disease with heart failure: Secondary | ICD-10-CM | POA: Diagnosis not present

## 2017-10-12 DIAGNOSIS — I255 Ischemic cardiomyopathy: Secondary | ICD-10-CM | POA: Diagnosis not present

## 2017-10-12 DIAGNOSIS — I5022 Chronic systolic (congestive) heart failure: Secondary | ICD-10-CM | POA: Diagnosis not present

## 2017-10-12 DIAGNOSIS — Z6828 Body mass index (BMI) 28.0-28.9, adult: Secondary | ICD-10-CM | POA: Diagnosis not present

## 2017-10-12 DIAGNOSIS — J069 Acute upper respiratory infection, unspecified: Secondary | ICD-10-CM | POA: Diagnosis not present

## 2017-10-12 DIAGNOSIS — I10 Essential (primary) hypertension: Secondary | ICD-10-CM | POA: Diagnosis not present

## 2017-10-12 DIAGNOSIS — Z48812 Encounter for surgical aftercare following surgery on the circulatory system: Secondary | ICD-10-CM | POA: Diagnosis not present

## 2017-10-12 DIAGNOSIS — I2510 Atherosclerotic heart disease of native coronary artery without angina pectoris: Secondary | ICD-10-CM | POA: Diagnosis not present

## 2017-10-12 NOTE — Telephone Encounter (Signed)
That would be fine 

## 2017-10-12 NOTE — Telephone Encounter (Signed)
Per son, confirmed that z-pack was prescribed. Son advised that no interactions are listed with her other medications and message would be sent to her provider to confirm its okay to take this medication.

## 2017-10-12 NOTE — Telephone Encounter (Signed)
Per son, patient was given medication to take for cold symptoms by her PCP. Son Alinda Money) wasn't sure about the names of the medications but say's she was given a prednisone injection while in the office and an antibiotic prescription was sent to the pharmacy but hasn't been picked up yet. Son advised to contact our office back with the exact name of the medication before request can be sent to the provider. Verbalized understanding.

## 2017-10-12 NOTE — Telephone Encounter (Signed)
Patient informed. 

## 2017-10-12 NOTE — Telephone Encounter (Signed)
Patient went to see PCP today for a cough.  Was given antibiotics for cough.  Patient states that she is not to take any medications unless approved by cardiologist.

## 2017-10-14 ENCOUNTER — Other Ambulatory Visit: Payer: Self-pay

## 2017-10-14 DIAGNOSIS — I5022 Chronic systolic (congestive) heart failure: Secondary | ICD-10-CM | POA: Diagnosis not present

## 2017-10-14 DIAGNOSIS — Z48812 Encounter for surgical aftercare following surgery on the circulatory system: Secondary | ICD-10-CM | POA: Diagnosis not present

## 2017-10-14 DIAGNOSIS — I2510 Atherosclerotic heart disease of native coronary artery without angina pectoris: Secondary | ICD-10-CM | POA: Diagnosis not present

## 2017-10-14 DIAGNOSIS — I447 Left bundle-branch block, unspecified: Secondary | ICD-10-CM | POA: Diagnosis not present

## 2017-10-14 DIAGNOSIS — I255 Ischemic cardiomyopathy: Secondary | ICD-10-CM | POA: Diagnosis not present

## 2017-10-14 DIAGNOSIS — I110 Hypertensive heart disease with heart failure: Secondary | ICD-10-CM | POA: Diagnosis not present

## 2017-10-14 MED ORDER — CLOPIDOGREL BISULFATE 75 MG PO TABS: 75.0000 mg | ORAL_TABLET | Freq: Every day | ORAL | 3 refills | Status: AC

## 2017-10-14 MED ORDER — ATORVASTATIN CALCIUM 40 MG PO TABS: 40.0000 mg | ORAL_TABLET | Freq: Every day | ORAL | 3 refills | Status: AC

## 2017-10-14 NOTE — Telephone Encounter (Signed)
Refilled meds per [facility] mail order request

## 2017-10-18 DIAGNOSIS — I2510 Atherosclerotic heart disease of native coronary artery without angina pectoris: Secondary | ICD-10-CM | POA: Diagnosis not present

## 2017-10-18 DIAGNOSIS — Z48812 Encounter for surgical aftercare following surgery on the circulatory system: Secondary | ICD-10-CM | POA: Diagnosis not present

## 2017-10-18 DIAGNOSIS — I5022 Chronic systolic (congestive) heart failure: Secondary | ICD-10-CM | POA: Diagnosis not present

## 2017-10-18 DIAGNOSIS — I447 Left bundle-branch block, unspecified: Secondary | ICD-10-CM | POA: Diagnosis not present

## 2017-10-18 DIAGNOSIS — I255 Ischemic cardiomyopathy: Secondary | ICD-10-CM | POA: Diagnosis not present

## 2017-10-18 DIAGNOSIS — I110 Hypertensive heart disease with heart failure: Secondary | ICD-10-CM | POA: Diagnosis not present

## 2017-10-21 DIAGNOSIS — Z48812 Encounter for surgical aftercare following surgery on the circulatory system: Secondary | ICD-10-CM | POA: Diagnosis not present

## 2017-10-21 DIAGNOSIS — I447 Left bundle-branch block, unspecified: Secondary | ICD-10-CM | POA: Diagnosis not present

## 2017-10-21 DIAGNOSIS — I2510 Atherosclerotic heart disease of native coronary artery without angina pectoris: Secondary | ICD-10-CM | POA: Diagnosis not present

## 2017-10-21 DIAGNOSIS — I5022 Chronic systolic (congestive) heart failure: Secondary | ICD-10-CM | POA: Diagnosis not present

## 2017-10-21 DIAGNOSIS — I110 Hypertensive heart disease with heart failure: Secondary | ICD-10-CM | POA: Diagnosis not present

## 2017-10-21 DIAGNOSIS — I255 Ischemic cardiomyopathy: Secondary | ICD-10-CM | POA: Diagnosis not present

## 2017-10-25 DIAGNOSIS — Z1211 Encounter for screening for malignant neoplasm of colon: Secondary | ICD-10-CM | POA: Diagnosis not present

## 2017-10-25 DIAGNOSIS — Z0000 Encounter for general adult medical examination without abnormal findings: Secondary | ICD-10-CM | POA: Diagnosis not present

## 2017-10-25 DIAGNOSIS — Z7189 Other specified counseling: Secondary | ICD-10-CM | POA: Diagnosis not present

## 2017-10-25 DIAGNOSIS — Z1331 Encounter for screening for depression: Secondary | ICD-10-CM | POA: Diagnosis not present

## 2017-10-25 DIAGNOSIS — E7800 Pure hypercholesterolemia, unspecified: Secondary | ICD-10-CM | POA: Diagnosis not present

## 2017-10-25 DIAGNOSIS — Z299 Encounter for prophylactic measures, unspecified: Secondary | ICD-10-CM | POA: Diagnosis not present

## 2017-10-25 DIAGNOSIS — Z1339 Encounter for screening examination for other mental health and behavioral disorders: Secondary | ICD-10-CM | POA: Diagnosis not present

## 2017-10-25 DIAGNOSIS — Z6828 Body mass index (BMI) 28.0-28.9, adult: Secondary | ICD-10-CM | POA: Diagnosis not present

## 2017-10-25 DIAGNOSIS — R5383 Other fatigue: Secondary | ICD-10-CM | POA: Diagnosis not present

## 2017-10-25 DIAGNOSIS — E559 Vitamin D deficiency, unspecified: Secondary | ICD-10-CM | POA: Diagnosis not present

## 2017-10-25 DIAGNOSIS — Z79899 Other long term (current) drug therapy: Secondary | ICD-10-CM | POA: Diagnosis not present

## 2017-10-25 DIAGNOSIS — B356 Tinea cruris: Secondary | ICD-10-CM | POA: Diagnosis not present

## 2017-10-26 DIAGNOSIS — I447 Left bundle-branch block, unspecified: Secondary | ICD-10-CM | POA: Diagnosis not present

## 2017-10-26 DIAGNOSIS — Z48812 Encounter for surgical aftercare following surgery on the circulatory system: Secondary | ICD-10-CM | POA: Diagnosis not present

## 2017-10-26 DIAGNOSIS — I2510 Atherosclerotic heart disease of native coronary artery without angina pectoris: Secondary | ICD-10-CM | POA: Diagnosis not present

## 2017-10-26 DIAGNOSIS — I110 Hypertensive heart disease with heart failure: Secondary | ICD-10-CM | POA: Diagnosis not present

## 2017-10-26 DIAGNOSIS — I255 Ischemic cardiomyopathy: Secondary | ICD-10-CM | POA: Diagnosis not present

## 2017-10-26 DIAGNOSIS — I5022 Chronic systolic (congestive) heart failure: Secondary | ICD-10-CM | POA: Diagnosis not present

## 2017-10-28 DIAGNOSIS — I2510 Atherosclerotic heart disease of native coronary artery without angina pectoris: Secondary | ICD-10-CM | POA: Diagnosis not present

## 2017-10-28 DIAGNOSIS — I447 Left bundle-branch block, unspecified: Secondary | ICD-10-CM | POA: Diagnosis not present

## 2017-10-28 DIAGNOSIS — I255 Ischemic cardiomyopathy: Secondary | ICD-10-CM | POA: Diagnosis not present

## 2017-10-28 DIAGNOSIS — I5022 Chronic systolic (congestive) heart failure: Secondary | ICD-10-CM | POA: Diagnosis not present

## 2017-10-28 DIAGNOSIS — Z48812 Encounter for surgical aftercare following surgery on the circulatory system: Secondary | ICD-10-CM | POA: Diagnosis not present

## 2017-10-28 DIAGNOSIS — I110 Hypertensive heart disease with heart failure: Secondary | ICD-10-CM | POA: Diagnosis not present

## 2017-12-13 ENCOUNTER — Encounter (HOSPITAL_COMMUNITY): Payer: Self-pay | Admitting: *Deleted

## 2017-12-13 ENCOUNTER — Other Ambulatory Visit: Payer: Self-pay

## 2017-12-13 ENCOUNTER — Emergency Department (HOSPITAL_COMMUNITY): Payer: Medicare Other

## 2017-12-13 ENCOUNTER — Telehealth: Payer: Self-pay | Admitting: Cardiovascular Disease

## 2017-12-13 ENCOUNTER — Inpatient Hospital Stay (HOSPITAL_COMMUNITY)
Admission: EM | Admit: 2017-12-13 | Discharge: 2018-01-05 | DRG: 291 | Disposition: E | Payer: Medicare Other | Attending: Internal Medicine | Admitting: Internal Medicine

## 2017-12-13 DIAGNOSIS — F419 Anxiety disorder, unspecified: Secondary | ICD-10-CM | POA: Diagnosis not present

## 2017-12-13 DIAGNOSIS — Z888 Allergy status to other drugs, medicaments and biological substances status: Secondary | ICD-10-CM

## 2017-12-13 DIAGNOSIS — I130 Hypertensive heart and chronic kidney disease with heart failure and stage 1 through stage 4 chronic kidney disease, or unspecified chronic kidney disease: Principal | ICD-10-CM | POA: Diagnosis present

## 2017-12-13 DIAGNOSIS — R0602 Shortness of breath: Secondary | ICD-10-CM | POA: Diagnosis not present

## 2017-12-13 DIAGNOSIS — R5383 Other fatigue: Secondary | ICD-10-CM | POA: Diagnosis not present

## 2017-12-13 DIAGNOSIS — I255 Ischemic cardiomyopathy: Secondary | ICD-10-CM

## 2017-12-13 DIAGNOSIS — N179 Acute kidney failure, unspecified: Secondary | ICD-10-CM | POA: Diagnosis present

## 2017-12-13 DIAGNOSIS — E039 Hypothyroidism, unspecified: Secondary | ICD-10-CM | POA: Diagnosis present

## 2017-12-13 DIAGNOSIS — Z882 Allergy status to sulfonamides status: Secondary | ICD-10-CM

## 2017-12-13 DIAGNOSIS — K219 Gastro-esophageal reflux disease without esophagitis: Secondary | ICD-10-CM | POA: Diagnosis present

## 2017-12-13 DIAGNOSIS — K5900 Constipation, unspecified: Secondary | ICD-10-CM | POA: Diagnosis present

## 2017-12-13 DIAGNOSIS — Z7902 Long term (current) use of antithrombotics/antiplatelets: Secondary | ICD-10-CM | POA: Diagnosis not present

## 2017-12-13 DIAGNOSIS — I462 Cardiac arrest due to underlying cardiac condition: Secondary | ICD-10-CM | POA: Diagnosis not present

## 2017-12-13 DIAGNOSIS — R42 Dizziness and giddiness: Secondary | ICD-10-CM | POA: Diagnosis not present

## 2017-12-13 DIAGNOSIS — R002 Palpitations: Secondary | ICD-10-CM | POA: Diagnosis not present

## 2017-12-13 DIAGNOSIS — E559 Vitamin D deficiency, unspecified: Secondary | ICD-10-CM | POA: Diagnosis present

## 2017-12-13 DIAGNOSIS — Z90710 Acquired absence of both cervix and uterus: Secondary | ICD-10-CM

## 2017-12-13 DIAGNOSIS — Z6824 Body mass index (BMI) 24.0-24.9, adult: Secondary | ICD-10-CM | POA: Diagnosis not present

## 2017-12-13 DIAGNOSIS — I447 Left bundle-branch block, unspecified: Secondary | ICD-10-CM | POA: Diagnosis present

## 2017-12-13 DIAGNOSIS — I272 Pulmonary hypertension, unspecified: Secondary | ICD-10-CM | POA: Diagnosis present

## 2017-12-13 DIAGNOSIS — I959 Hypotension, unspecified: Secondary | ICD-10-CM | POA: Diagnosis not present

## 2017-12-13 DIAGNOSIS — I5043 Acute on chronic combined systolic (congestive) and diastolic (congestive) heart failure: Secondary | ICD-10-CM | POA: Diagnosis not present

## 2017-12-13 DIAGNOSIS — Z841 Family history of disorders of kidney and ureter: Secondary | ICD-10-CM

## 2017-12-13 DIAGNOSIS — I2510 Atherosclerotic heart disease of native coronary artery without angina pectoris: Secondary | ICD-10-CM

## 2017-12-13 DIAGNOSIS — I4891 Unspecified atrial fibrillation: Secondary | ICD-10-CM | POA: Diagnosis present

## 2017-12-13 DIAGNOSIS — Z9842 Cataract extraction status, left eye: Secondary | ICD-10-CM

## 2017-12-13 DIAGNOSIS — I080 Rheumatic disorders of both mitral and aortic valves: Secondary | ICD-10-CM | POA: Diagnosis present

## 2017-12-13 DIAGNOSIS — Z79899 Other long term (current) drug therapy: Secondary | ICD-10-CM

## 2017-12-13 DIAGNOSIS — Z7189 Other specified counseling: Secondary | ICD-10-CM | POA: Diagnosis not present

## 2017-12-13 DIAGNOSIS — J9601 Acute respiratory failure with hypoxia: Secondary | ICD-10-CM

## 2017-12-13 DIAGNOSIS — Z8249 Family history of ischemic heart disease and other diseases of the circulatory system: Secondary | ICD-10-CM

## 2017-12-13 DIAGNOSIS — N183 Chronic kidney disease, stage 3 (moderate): Secondary | ICD-10-CM | POA: Diagnosis present

## 2017-12-13 DIAGNOSIS — G8929 Other chronic pain: Secondary | ICD-10-CM | POA: Diagnosis present

## 2017-12-13 DIAGNOSIS — Z955 Presence of coronary angioplasty implant and graft: Secondary | ICD-10-CM

## 2017-12-13 DIAGNOSIS — Z79891 Long term (current) use of opiate analgesic: Secondary | ICD-10-CM

## 2017-12-13 DIAGNOSIS — Z515 Encounter for palliative care: Secondary | ICD-10-CM | POA: Diagnosis not present

## 2017-12-13 DIAGNOSIS — M109 Gout, unspecified: Secondary | ICD-10-CM | POA: Diagnosis not present

## 2017-12-13 DIAGNOSIS — Z299 Encounter for prophylactic measures, unspecified: Secondary | ICD-10-CM | POA: Diagnosis not present

## 2017-12-13 DIAGNOSIS — I10 Essential (primary) hypertension: Secondary | ICD-10-CM | POA: Diagnosis not present

## 2017-12-13 DIAGNOSIS — I252 Old myocardial infarction: Secondary | ICD-10-CM | POA: Diagnosis not present

## 2017-12-13 DIAGNOSIS — Z9841 Cataract extraction status, right eye: Secondary | ICD-10-CM

## 2017-12-13 DIAGNOSIS — I469 Cardiac arrest, cause unspecified: Secondary | ICD-10-CM | POA: Diagnosis not present

## 2017-12-13 DIAGNOSIS — Z7982 Long term (current) use of aspirin: Secondary | ICD-10-CM

## 2017-12-13 DIAGNOSIS — R001 Bradycardia, unspecified: Secondary | ICD-10-CM | POA: Diagnosis not present

## 2017-12-13 DIAGNOSIS — R4182 Altered mental status, unspecified: Secondary | ICD-10-CM | POA: Diagnosis not present

## 2017-12-13 DIAGNOSIS — R531 Weakness: Secondary | ICD-10-CM | POA: Diagnosis not present

## 2017-12-13 DIAGNOSIS — Z79890 Hormone replacement therapy (postmenopausal): Secondary | ICD-10-CM

## 2017-12-13 DIAGNOSIS — H9190 Unspecified hearing loss, unspecified ear: Secondary | ICD-10-CM | POA: Diagnosis present

## 2017-12-13 DIAGNOSIS — Z7951 Long term (current) use of inhaled steroids: Secondary | ICD-10-CM

## 2017-12-13 DIAGNOSIS — I5023 Acute on chronic systolic (congestive) heart failure: Secondary | ICD-10-CM | POA: Diagnosis not present

## 2017-12-13 HISTORY — DX: Pulmonary hypertension, unspecified: I27.20

## 2017-12-13 HISTORY — DX: Dyspnea, unspecified: R06.00

## 2017-12-13 HISTORY — DX: Other ventricular tachycardia: I47.29

## 2017-12-13 HISTORY — DX: Other injury of unspecified body region, initial encounter: T14.8XXA

## 2017-12-13 HISTORY — DX: Nonrheumatic aortic (valve) insufficiency: I35.1

## 2017-12-13 HISTORY — DX: Nonrheumatic mitral (valve) insufficiency: I34.0

## 2017-12-13 HISTORY — DX: Chronic systolic (congestive) heart failure: I50.22

## 2017-12-13 HISTORY — DX: Ventricular tachycardia: I47.2

## 2017-12-13 LAB — BASIC METABOLIC PANEL
Anion gap: 12 (ref 5–15)
BUN: 25 mg/dL — AB (ref 8–23)
CO2: 19 mmol/L — AB (ref 22–32)
CREATININE: 1.14 mg/dL — AB (ref 0.44–1.00)
Calcium: 11 mg/dL — ABNORMAL HIGH (ref 8.9–10.3)
Chloride: 98 mmol/L (ref 98–111)
GFR calc non Af Amer: 43 mL/min — ABNORMAL LOW (ref >60)
GFR, EST AFRICAN AMERICAN: 50 mL/min — AB (ref >60)
GLUCOSE: 122 mg/dL — AB (ref 70–99)
Potassium: 5 mmol/L (ref 3.5–5.1)
Sodium: 129 mmol/L — ABNORMAL LOW (ref 135–145)

## 2017-12-13 LAB — CBC
HEMATOCRIT: 44.2 % (ref 36.0–46.0)
Hemoglobin: 14.7 g/dL (ref 12.0–15.0)
MCH: 34.3 pg — AB (ref 26.0–34.0)
MCHC: 33.3 g/dL (ref 30.0–36.0)
MCV: 103.3 fL — AB (ref 80.0–100.0)
PLATELETS: 172 10*3/uL (ref 150–400)
RBC: 4.28 MIL/uL (ref 3.87–5.11)
RDW: 13.8 % (ref 11.5–15.5)
WBC: 5.5 10*3/uL (ref 4.0–10.5)
nRBC: 0 % (ref 0.0–0.2)

## 2017-12-13 LAB — BRAIN NATRIURETIC PEPTIDE: B Natriuretic Peptide: 1366 pg/mL — ABNORMAL HIGH (ref 0.0–100.0)

## 2017-12-13 LAB — TROPONIN I: Troponin I: 0.18 ng/mL (ref <0.03)

## 2017-12-13 LAB — APTT: aPTT: 32 seconds (ref 24–36)

## 2017-12-13 LAB — PROTIME-INR
INR: 1.11
Prothrombin Time: 14.2 seconds (ref 11.4–15.2)

## 2017-12-13 LAB — TSH: TSH: 1.761 u[IU]/mL (ref 0.350–4.500)

## 2017-12-13 LAB — MAGNESIUM: Magnesium: 2.1 mg/dL (ref 1.7–2.4)

## 2017-12-13 MED ORDER — PANTOPRAZOLE SODIUM 40 MG PO TBEC
40.0000 mg | DELAYED_RELEASE_TABLET | Freq: Every morning | ORAL | Status: DC
  Administered 2017-12-14: 40 mg via ORAL
  Filled 2017-12-13: qty 1

## 2017-12-13 MED ORDER — ACETAMINOPHEN 325 MG PO TABS
650.0000 mg | ORAL_TABLET | ORAL | Status: DC | PRN
  Administered 2017-12-14: 650 mg via ORAL
  Filled 2017-12-13 (×2): qty 2

## 2017-12-13 MED ORDER — VITAMIN D 25 MCG (1000 UNIT) PO TABS
2000.0000 [IU] | ORAL_TABLET | Freq: Every morning | ORAL | Status: DC
  Administered 2017-12-14: 2000 [IU] via ORAL
  Filled 2017-12-13: qty 2

## 2017-12-13 MED ORDER — FLUTICASONE PROPIONATE 50 MCG/ACT NA SUSP
1.0000 | Freq: Every morning | NASAL | Status: DC
  Filled 2017-12-13: qty 16

## 2017-12-13 MED ORDER — CLOPIDOGREL BISULFATE 75 MG PO TABS
75.0000 mg | ORAL_TABLET | Freq: Every day | ORAL | Status: DC
  Administered 2017-12-14: 75 mg via ORAL
  Filled 2017-12-13 (×2): qty 1

## 2017-12-13 MED ORDER — SODIUM CHLORIDE 0.9 % IV BOLUS
500.0000 mL | Freq: Once | INTRAVENOUS | Status: AC
  Administered 2017-12-13: 500 mL via INTRAVENOUS

## 2017-12-13 MED ORDER — HEPARIN (PORCINE) 25000 UT/250ML-% IV SOLN
650.0000 [IU]/h | INTRAVENOUS | Status: DC
  Filled 2017-12-13: qty 250

## 2017-12-13 MED ORDER — LEVOTHYROXINE SODIUM 88 MCG PO TABS
88.0000 ug | ORAL_TABLET | Freq: Every day | ORAL | Status: DC
  Administered 2017-12-14: 88 ug via ORAL
  Filled 2017-12-13: qty 1

## 2017-12-13 MED ORDER — METOPROLOL TARTRATE 5 MG/5ML IV SOLN
2.5000 mg | Freq: Once | INTRAVENOUS | Status: AC
  Administered 2017-12-13: 2.5 mg via INTRAVENOUS
  Filled 2017-12-13: qty 5

## 2017-12-13 MED ORDER — ONDANSETRON HCL 4 MG/2ML IJ SOLN
4.0000 mg | Freq: Four times a day (QID) | INTRAMUSCULAR | Status: DC | PRN
  Administered 2017-12-14: 4 mg via INTRAVENOUS
  Filled 2017-12-13: qty 2

## 2017-12-13 MED ORDER — HEPARIN BOLUS VIA INFUSION
4000.0000 [IU] | Freq: Once | INTRAVENOUS | Status: AC
  Administered 2017-12-13: 4000 [IU] via INTRAVENOUS

## 2017-12-13 MED ORDER — ATORVASTATIN CALCIUM 40 MG PO TABS
40.0000 mg | ORAL_TABLET | Freq: Every morning | ORAL | Status: DC
  Administered 2017-12-14: 40 mg via ORAL
  Filled 2017-12-13: qty 1

## 2017-12-13 MED ORDER — METOPROLOL TARTRATE 25 MG PO TABS
25.0000 mg | ORAL_TABLET | Freq: Two times a day (BID) | ORAL | Status: DC
  Administered 2017-12-14: 25 mg via ORAL
  Filled 2017-12-13 (×2): qty 1

## 2017-12-13 MED ORDER — METOPROLOL TARTRATE 5 MG/5ML IV SOLN: 2.5000 mg | INTRAVENOUS | Status: DC

## 2017-12-13 MED ORDER — FUROSEMIDE 10 MG/ML IJ SOLN: 20.0000 mg | Freq: Once | INTRAMUSCULAR | Status: DC

## 2017-12-13 MED ORDER — METOPROLOL TARTRATE 25 MG PO TABS
25.0000 mg | ORAL_TABLET | Freq: Once | ORAL | Status: AC
  Administered 2017-12-13: 25 mg via ORAL
  Filled 2017-12-13: qty 1

## 2017-12-13 MED ORDER — ACETAMINOPHEN 325 MG PO TABS
650.0000 mg | ORAL_TABLET | Freq: Once | ORAL | Status: AC
  Administered 2017-12-13: 650 mg via ORAL
  Filled 2017-12-13: qty 2

## 2017-12-13 NOTE — Telephone Encounter (Signed)
Stop Toprol-XL. See if she can get an appt with APP (any office) sooner.

## 2017-12-13 NOTE — ED Notes (Signed)
EDP aware of BP. 

## 2017-12-13 NOTE — H&P (Addendum)
Cardiology History & Physical    Patient ID: Danielle Bell MRN: [id number], DOB: [id number] Date of Encounter: 12/19/2017, 10:12 PM Primary Physician: Kirstie Peri, MD Primary Cardiologist: Prentice Docker, MD Primary Electrophysiologist:  None  Chief Complaint: Dyspnea Reason for Admission: Atrial fibrillation with rapid ventricular response Requesting MD: Rhunette Croft  HPI: Danielle Bell is a 84 y.o. female with history of heart failure with reduced ejection fraction (LVEF=25-30%), coronary artery disease (status-post PCI to the LAD 09/2017), hypertension, left bundle branch block, who presents with three days of fatigue, dyspnea, decreased appetite, and palpitations found to be in atrial fibrillation with rapid ventricular response.  Danielle Bell reports that she was in her usual state of health until approximately three days ago when she began to feel profoundly fatigued. This was accompanied by significant dyspnea ('heavy breathing") and a sense of irregular, rapid heart rhythm. Her dyspnea has interfered with her sleeping and is worse with laying down flat. Her appetite has been decreased and she has not had much to eat or drink all day. Due to her worsening symptoms she visited an outpatient provider this afternoon who directed her to the Emergency Department.  She has had more frequent bowel movements recently due to taking miralax for constipation. She denies any pain or burning on urination. No fevers or chills or recent sick contacts. No syncope or pre-syncope. No chest pain. No nausea or vomiting. No history of gastrointestinal bleeding or recent bleeding/bruising issues. She has not missed any doses of her dual anti-platelet therapy recently.   Past Medical History:  Diagnosis Date  . Actinic keratosis   . Aortic insufficiency   . Arthritis    "qwhere" (09/28/2017)  . Chronic back pain    "mostly lower" (09/28/2017)  . Chronic systolic CHF (congestive heart failure) (HCC)     . Coronary artery disease    a. 09/2017 - abnormal nuc -> PTCA/DES x1 to the LAD and balloon angioplasty to the 2nd diagonal.  . Degenerative joint disease   . Gout   . Hearing loss   . Hematoma    a. Cath 09/2017 complicated by radial hematoma.  . High cholesterol   . Hypercalcemia   . Hypertension   . Hypothyroidism   . Keratosis, seborrheic   . LBBB (left bundle branch block)   . Macrocytosis   . Mitral regurgitation   . Myocardial infarction (HCC) 07/2017  . NSVT (nonsustained ventricular tachycardia) (HCC)    a. per tele 09/2017  . Premature beats   . Primary ovarian failure   . Pulmonary hypertension (HCC)   . Renal insufficiency syndrome   . Shoulder arthritis   . Tachycardia   . Vitamin D deficiency      Surgical History:  Past Surgical History:  Procedure Laterality Date  . ABDOMINAL HYSTERECTOMY  1970s  . APPENDECTOMY    . CARDIAC CATHETERIZATION  09/27/2017   "unsuccessful for stent"  . CATARACT EXTRACTION, BILATERAL Bilateral   . COLONOSCOPY    . CORONARY ANGIOPLASTY WITH STENT PLACEMENT  09/28/2017   "1 stent" (09/28/2017)  . CORONARY STENT INTERVENTION N/A 09/28/2017   Procedure: CORONARY STENT INTERVENTION;  Surgeon: Corky Crafts, MD;  Location: [facility] INVASIVE CV LAB;  Service: Cardiovascular;  Laterality: N/A;  . RIGHT/LEFT HEART CATH AND CORONARY ANGIOGRAPHY N/A 09/27/2017   Procedure: RIGHT/LEFT HEART CATH AND CORONARY ANGIOGRAPHY;  Surgeon: Corky Crafts, MD;  Location: [facility] INVASIVE CV LAB;  Service: Cardiovascular;  Laterality: N/A;     Home Meds:  Prior to Admission medications   Medication Sig Start Date End Date Taking? Authorizing Provider  aspirin EC 81 MG tablet Take 1 tablet (81 mg total) by mouth daily. Patient taking differently: Take 81 mg by mouth every morning.  08/19/17  Yes Laqueta Linden, MD  atorvastatin (LIPITOR) 40 MG tablet Take 1 tablet (40 mg total) by mouth daily. Patient taking differently: Take 40 mg by mouth  every morning.  10/14/17  Yes Laqueta Linden, MD  cholecalciferol (VITAMIN D) 1000 units tablet Take 2,000 Units by mouth every morning.    Yes [provider]  clopidogrel (PLAVIX) 75 MG tablet Take 1 tablet (75 mg total) by mouth daily with breakfast. 10/14/17  Yes Laqueta Linden, MD  fluticasone (FLONASE) 50 MCG/ACT nasal spray Place 1 spray into both nostrils every morning.    Yes [provider]  furosemide (LASIX) 20 MG tablet Take 1 tablet (20 mg total) by mouth daily. Patient taking differently: Take 10 mg by mouth every morning.  10/08/17  Yes Laqueta Linden, MD  levothyroxine (SYNTHROID, LEVOTHROID) 88 MCG tablet Take 88 mcg by mouth daily before breakfast.   Yes [provider]  metoprolol succinate (TOPROL-XL) 12.5 mg TB24 24 hr tablet Take 12.5 mg by mouth every morning.   Yes [provider]  pantoprazole (PROTONIX) 40 MG tablet Take 40 mg by mouth every morning.    Yes [provider]  Polyethylene Glycol 3350 (MIRALAX PO) Take 17 g by mouth daily as needed (constipation).    Yes [provider]  potassium chloride (K-DUR) 10 MEQ tablet Take 1 tablet (10 mEq total) by mouth daily. Patient taking differently: Take 10 mEq by mouth every morning.  10/08/17  Yes Laqueta Linden, MD  traMADol (ULTRAM) 50 MG tablet Take 50 mg by mouth 3 (three) times daily.    Yes [provider]    Allergies:  Allergies  Allergen Reactions  . Hctz [Hydrochlorothiazide]     Possible high calcium   . Lescol Xl [Fluvastatin]     Reflux   . Sulfa Antibiotics     abd upset     Social History   Socioeconomic History  . Marital status: Widowed    Spouse name: Not on file  . Number of children: Not on file  . Years of education: Not on file  . Highest education level: Not on file  Occupational History  . Not on file  Social Needs  . Financial resource strain: Not on file  . Food insecurity:    Worry: Not on  file    Inability: Not on file  . Transportation needs:    Medical: Not on file    Non-medical: Not on file  Tobacco Use  . Smoking status: Never Smoker  . Smokeless tobacco: Never Used  Substance and Sexual Activity  . Alcohol use: Never    Frequency: Never  . Drug use: Never  . Sexual activity: Not on file  Lifestyle  . Physical activity:    Days per week: Not on file    Minutes per session: Not on file  . Stress: Not on file  Relationships  . Social connections:    Talks on phone: Not on file    Gets together: Not on file    Attends religious service: Not on file    Active member of club or organization: Not on file    Attends meetings of clubs or organizations: Not on file  Relationship status: Not on file  . Intimate partner violence:    Fear of current or ex partner: Not on file    Emotionally abused: Not on file    Physically abused: Not on file    Forced sexual activity: Not on file  Other Topics Concern  . Not on file  Social History Narrative  . Not on file     Family History  Problem Relation Age of Onset  . Glaucoma Sister   . Hypertension Brother   . Glaucoma Sister   . Chronic Renal Failure Sister   . Hypertension Sister     Review of Systems: Review of Systems  Constitutional: Positive for malaise/fatigue. Negative for chills, diaphoresis, fever and weight loss.  HENT: Positive for hearing loss. Negative for congestion, ear discharge, nosebleeds, sinus pain and sore throat.   Eyes: Negative for blurred vision, double vision and photophobia.  Respiratory: Positive for shortness of breath. Negative for cough, hemoptysis and sputum production.   Cardiovascular: Positive for palpitations, orthopnea and PND. Negative for chest pain, claudication and leg swelling.  Gastrointestinal: Positive for diarrhea and nausea. Negative for abdominal pain, blood in stool, constipation, heartburn and vomiting.  Genitourinary: Negative for dysuria, frequency and  urgency.  Musculoskeletal: Negative for myalgias.  Skin: Negative for rash.  Neurological: Positive for dizziness and weakness. Negative for tingling, tremors, sensory change, speech change and focal weakness.  Endo/Heme/Allergies: Negative for polydipsia. Does not bruise/bleed easily.  Psychiatric/Behavioral: Positive for memory loss. Negative for hallucinations. The patient is not nervous/anxious.      Labs:   Lab Results  Component Value Date   WBC 5.5 12/31/2017   HGB 14.7 12/08/2017   HCT 44.2 12/09/2017   MCV 103.3 (H) 12/08/2017   PLT 172 12/07/2017    Recent Labs  Lab 12/10/2017 1600  NA 129*  K 5.0  CL 98  CO2 19*  BUN 25*  CREATININE 1.14*  CALCIUM 11.0*  GLUCOSE 122*   No results for input(s): CKTOTAL, CKMB, TROPONINI in the last 72 hours. No results found for: CHOL, HDL, LDLCALC, TRIG No results found for: DDIMER  Radiology/Studies:  Dg Chest Port 1 View  Result Date: 12/24/2017 CLINICAL DATA:  Atrial fibrillation. EXAM: PORTABLE CHEST 1 VIEW COMPARISON:  None. FINDINGS: Heart size is within normal limits considering the AP portable technique. Aortic atherosclerosis. Pulmonary vascularity is at the upper limits of normal. No infiltrates or effusions. No acute bone abnormality. IMPRESSION: No acute cardiopulmonary findings. Aortic atherosclerosis. Electronically Signed   By: Francene Boyers M.D.   On: 12/12/2017 16:29   Wt Readings from Last 3 Encounters:  12/22/2017 67.1 kg  10/08/17 67.1 kg  10/01/17 69 kg    EKG: Atrial fibrillation with rapid ventricular response, left bundle branch block  Physical Exam: Blood pressure (!) 121/93, pulse 80, temperature (!) 97 F (36.1 C), resp. rate 18, height 5\' 3"  (1.6 m), weight 67.1 kg, SpO2 92 %. Body mass index is 26.2 kg/m. General: Well developed, well nourished, in no acute distress. Head: Normocephalic, atraumatic, sclera non-icteric, no xanthomas, nares are without discharge.  Neck: Negative for carotid  bruits. JVD not elevated. Lungs: Clear bilaterally to auscultation without wheezes, rales, or rhonchi. Breathing is unlabored. Heart: Rapid irregularly irregular No murmurs, rubs, or gallops appreciated. Abdomen: Soft, non-tender, non-distended with normoactive bowel sounds. No hepatomegaly. No rebound/guarding. No obvious abdominal masses. Msk:  Strength and tone appear normal for age. Extremities: No clubbing or cyanosis. 1+ edema.  Distal pedal pulses are 2+  and equal bilaterally. Neuro: Alert and oriented X 3. No focal deficit. No facial asymmetry. Moves all extremities spontaneously. Psych:  Responds to questions appropriately with a normal affect.    Assessment and Plan  Danielle Bell is a 84 y.o. female with history of heart failure with reduced ejection fraction (LVEF=25-30%), coronary artery disease (status-post PCI to the LAD 09/2017), hypertension, left bundle branch block, who presents with three days of fatigue, dyspnea, decreased appetite, and palpitations found to be in atrial fibrillation with rapid ventricular response.  1. Atrial fibrillation with rapid ventricular response. She presents with new-onset sustained episode of AF by history that has led to acute on chronic systolic heart failure exacerbation. Anticoagulation will be initiated to prevent stroke based on her CHA2DS2-VASc score of 6 (age > 47 (+2), hypertension (+1), vascular disease (+1), female gender (+1), heart failure (+1)) with IV unfractionated heparin with anticipated conversion to oral apixiban tomorrow.  - IV UFH - Hold ASA (high risk of bleeding complications on triple therapy in this 84 yo female) - Continue clopidogrel - Metoprolol 25 mg PO BID - NPO @ [facility] for TEE/DCCV tomorrow - If recurrence consider amiodarone  2. Acute on chronic systolic HF. Worsening HF symptoms precipitated by AF.  - Furosemide 20 mg IV x 1 - Metoprolol tartrate as above; plan on consolidating to metoprolol succinate prior to  discharge  - Can consider repeat TTE after DCCV - HFrEF with wide QRS LBBB, could consider CRT-P  3. Coronary artery disease. Troponin abnormal; suspect demand-mediated with acute HF and AF+RVR.  - Continue clopidogrel, UFH, and atorvastatin 40 - Serial troponins  4. Hypothyroidism - Continue levothyroxine  5. GERD - Continue pantoprazole    Severity of Illness: The appropriate patient status for this patient is INPATIENT. Inpatient status is judged to be reasonable and necessary in order to provide the required intensity of service to ensure the patient's safety. The patient's presenting symptoms, physical exam findings, and initial radiographic and laboratory data in the context of their chronic comorbidities is felt to place them at high risk for further clinical deterioration. Furthermore, it is not anticipated that the patient will be medically stable for discharge from the hospital within 2 midnights of admission. The following factors support the patient status of inpatient.   " The patient's presenting symptoms include dyspnea. " The worrisome physical exam findings include rapid, irregular heart rate. " The initial radiographic and laboratory data are worrisome because of abnormal potassium. " The chronic co-morbidities include coronary artery disease, chronic systolic heart failure.   * I certify that at the point of admission it is my clinical judgment that the patient will require inpatient hospital care spanning beyond 2 midnights from the point of admission due to high intensity of service, high risk for further deterioration and high frequency of surveillance required.*    For questions or updates, please contact [facility] Please consult [email or web address] for contact info under Cardiology/STEMI.  Signed, Laverda Page, MD 16-Dec-2017, 10:12 PM

## 2017-12-13 NOTE — Telephone Encounter (Signed)
Tony-son called stating that his mother is having irregular heart beats for 2 days now. Patient states that she felt nauseated last evening. Please call [phone].

## 2017-12-13 NOTE — Telephone Encounter (Signed)
Pt son Alinda Money says for the last 2 days pt has been washed out/weak - some SOB - BP readings for the last 2 days  97/79 HR 112 90/63 HR 82 102/72 HR 63 says the last several nights while laying down feels palpitations - denies any dizziness/CP - has upcoming appt with Randall An, PA 01/06/18 - taking Toprol XL 12.5 mg daily

## 2017-12-13 NOTE — Progress Notes (Signed)
ANTICOAGULATION CONSULT NOTE - Initial Consult  Pharmacy Consult for Heparin Indication: atrial fibrillation  Allergies  Allergen Reactions  . Asa [Aspirin]     Epigastric discomfort; hives (Able to tolerate in low dose)  . Ciprofloxacin     Headache   . Hctz [Hydrochlorothiazide]     Possible high calcium   . Lescol Xl [Fluvastatin]     Reflux   . Sulfa Antibiotics     abd upset     Patient Measurements: Height: 5\' 3"  (160 cm) Weight: 147 lb 14.9 oz (67.1 kg) IBW/kg (Calculated) : 52.4 HEPARIN DW (KG): 66  Vital Signs: Temp: 98.8 F (37.1 C) (12/09 1600) Temp Source: Rectal (12/09 1600) BP: 110/77 (12/09 1600) Pulse Rate: 90 (12/09 1600)  Labs: No results for input(s): HGB, HCT, PLT, APTT, LABPROT, INR, HEPARINUNFRC, HEPRLOWMOCWT, CREATININE, CKTOTAL, CKMB, TROPONINI in the last 72 hours.  CrCl cannot be calculated (Patient's most recent lab result is older than the maximum 21 days allowed.).   Medical History: Past Medical History:  Diagnosis Date  . Actinic keratosis   . Arthritis    "qwhere" (09/28/2017)  . Chronic back pain    "mostly lower" (09/28/2017)  . Coronary artery disease   . Degenerative joint disease   . Gout   . Hearing loss   . Hearing loss   . High cholesterol   . Hypercalcemia   . Hypertension   . Hypothyroidism   . Keratosis, seborrheic   . LBBB (left bundle branch block)   . Macrocytosis   . Myocardial infarction (HCC) 07/2017  . Premature beats   . Primary ovarian failure   . Renal insufficiency syndrome   . Shoulder arthritis   . Tachycardia   . Vitamin D deficiency     Medications:  See med rec   Assessment: Patient sent to ED due to irregular heart beat. She has been nauseated. Patient is in atrial fib. Pharmacy asked to start heparin.  Goal of Therapy:  Heparin level 0.3-0.7 units/ml Monitor platelets by anticoagulation protocol: Yes   Plan:  Give 4000 units bolus x 1 Start heparin infusion at 800 units/hr Check  anti-Xa level in 6-8 hours and daily while on heparin Continue to monitor H&H and platelets   Elder Cyphers, BS Loura Back, BCPS Clinical Pharmacist Pager [phone] 12/22/2017,4:30 PM

## 2017-12-13 NOTE — ED Triage Notes (Signed)
Sent from her PCP with c/o afib

## 2017-12-13 NOTE — Telephone Encounter (Signed)
Pt son Alinda Money aware - update medication list - scheduled with Corine Shelter, [place] 12/27/17 - pt also has an appt with Dr Sherryll Burger today for congestion and urination problems

## 2017-12-13 NOTE — Progress Notes (Signed)
Pt. With critical Troponin value of 0.18. On call for Cardiology paged to make aware.

## 2017-12-13 NOTE — ED Provider Notes (Addendum)
[facility] EMERGENCY DEPARTMENT Provider Note   CSN: [id number] Arrival date & time: Dec 29, 2017  1536     History   Chief Complaint Chief Complaint  Patient presents with  . Atrial Fibrillation    HPI Danielle Bell is a 84 y.o. female.  HPI  84 year old female with history of coronary artery disease and left bundle branch block comes in with chief complaint of shortness of breath, weakness and abnormal EKG.  Patient reports that normally she is quite active.  Over the past 2 days she has started having increasing weakness and fatigue, shortness of breath with exertion, palpitations and generally not feeling well.  She also noticed that her blood pressure was running in the 90s and 100s systolic -which is lower for her. She called her PCP and saw them earlier today and was noted to be new onset A. Fib.  Patient denies any history of PE and does not have any risk factors for it.  She denies medication noncompliance besides skipping the morning metoprolol because of her low blood pressure.  There is no history of thyroid disorder.  Patient does indicate significant amount of loose bowel movements after she took MiraLAX, but she denies any recent viral illness.  Past Medical History:  Diagnosis Date  . Actinic keratosis   . Aortic insufficiency   . Arthritis    "qwhere" (09/28/2017)  . Chronic back pain    "mostly lower" (09/28/2017)  . Chronic systolic CHF (congestive heart failure) (HCC)   . Coronary artery disease    a. 09/2017 - abnormal nuc -> PTCA/DES x1 to the LAD and balloon angioplasty to the 2nd diagonal.  . Degenerative joint disease   . Gout   . Hearing loss   . Hematoma    a. Cath 09/2017 complicated by radial hematoma.  . High cholesterol   . Hypercalcemia   . Hypertension   . Hypothyroidism   . Keratosis, seborrheic   . LBBB (left bundle branch block)   . Macrocytosis   . Mitral regurgitation   . Myocardial infarction (HCC) 07/2017  . NSVT (nonsustained  ventricular tachycardia) (HCC)    a. per tele 09/2017  . Premature beats   . Primary ovarian failure   . Pulmonary hypertension (HCC)   . Renal insufficiency syndrome   . Shoulder arthritis   . Tachycardia   . Vitamin D deficiency     Patient Active Problem List   Diagnosis Date Noted  . New onset atrial fibrillation (HCC) 2017-12-29  . Atrial fibrillation with RVR (HCC) 12-29-2017  . Atrial fibrillation with rapid ventricular response (HCC) 2017-12-29  . CAD in native artery 09/27/2017  . Abnormal stress test     Past Surgical History:  Procedure Laterality Date  . ABDOMINAL HYSTERECTOMY  1970s  . APPENDECTOMY    . CARDIAC CATHETERIZATION  09/27/2017   "unsuccessful for stent"  . CATARACT EXTRACTION, BILATERAL Bilateral   . COLONOSCOPY    . CORONARY ANGIOPLASTY WITH STENT PLACEMENT  09/28/2017   "1 stent" (09/28/2017)  . CORONARY STENT INTERVENTION N/A 09/28/2017   Procedure: CORONARY STENT INTERVENTION;  Surgeon: Corky Crafts, MD;  Location: [facility] INVASIVE CV LAB;  Service: Cardiovascular;  Laterality: N/A;  . RIGHT/LEFT HEART CATH AND CORONARY ANGIOGRAPHY N/A 09/27/2017   Procedure: RIGHT/LEFT HEART CATH AND CORONARY ANGIOGRAPHY;  Surgeon: Corky Crafts, MD;  Location: [facility] INVASIVE CV LAB;  Service: Cardiovascular;  Laterality: N/A;     OB History   None  Home Medications    Prior to Admission medications   Medication Sig Start Date End Date Taking? Authorizing Provider  aspirin EC 81 MG tablet Take 1 tablet (81 mg total) by mouth daily. Patient taking differently: Take 81 mg by mouth every morning.  08/19/17  Yes Laqueta Linden, MD  atorvastatin (LIPITOR) 40 MG tablet Take 1 tablet (40 mg total) by mouth daily. Patient taking differently: Take 40 mg by mouth every morning.  10/14/17  Yes Laqueta Linden, MD  cholecalciferol (VITAMIN D) 1000 units tablet Take 2,000 Units by mouth every morning.    Yes [provider]  clopidogrel  (PLAVIX) 75 MG tablet Take 1 tablet (75 mg total) by mouth daily with breakfast. 10/14/17  Yes Laqueta Linden, MD  fluticasone (FLONASE) 50 MCG/ACT nasal spray Place 1 spray into both nostrils every morning.    Yes [provider]  furosemide (LASIX) 20 MG tablet Take 1 tablet (20 mg total) by mouth daily. Patient taking differently: Take 10 mg by mouth every morning.  10/08/17  Yes Laqueta Linden, MD  levothyroxine (SYNTHROID, LEVOTHROID) 88 MCG tablet Take 88 mcg by mouth daily before breakfast.   Yes [provider]  metoprolol succinate (TOPROL-XL) 12.5 mg TB24 24 hr tablet Take 12.5 mg by mouth every morning.   Yes [provider]  pantoprazole (PROTONIX) 40 MG tablet Take 40 mg by mouth every morning.    Yes [provider]  Polyethylene Glycol 3350 (MIRALAX PO) Take 17 g by mouth daily as needed (constipation).    Yes [provider]  potassium chloride (K-DUR) 10 MEQ tablet Take 1 tablet (10 mEq total) by mouth daily. Patient taking differently: Take 10 mEq by mouth every morning.  10/08/17  Yes Laqueta Linden, MD  traMADol (ULTRAM) 50 MG tablet Take 50 mg by mouth 3 (three) times daily.    Yes [provider]    Family History Family History  Problem Relation Age of Onset  . Glaucoma Sister   . Hypertension Brother   . Glaucoma Sister   . Chronic Renal Failure Sister   . Hypertension Sister     Social History Social History   Tobacco Use  . Smoking status: Never Smoker  . Smokeless tobacco: Never Used  Substance Use Topics  . Alcohol use: Never    Frequency: Never  . Drug use: Never     Allergies   Hctz [hydrochlorothiazide]; Lescol xl [fluvastatin]; and Sulfa antibiotics   Review of Systems Review of Systems  Constitutional: Positive for activity change and fatigue.  Respiratory: Positive for shortness of breath.   Cardiovascular: Positive for palpitations.  Skin: Positive for rash.    Hematological: Does not bruise/bleed easily.  All other systems reviewed and are negative.    Physical Exam Updated Vital Signs BP (!) 92/54 (BP Location: Left Arm)   Pulse (!) 56   Temp (!) 97 F (36.1 C) (Oral)   Resp 20   Ht 5\' 3"  (1.6 m)   Wt 67.1 kg   SpO2 93%   BMI 26.20 kg/m   Physical Exam  Constitutional: She is oriented to person, place, and time. She appears well-developed.  HENT:  Head: Normocephalic and atraumatic.  Eyes: EOM are normal.  Neck: Normal range of motion. Neck supple.  Cardiovascular:  Tachycardia, irregularly irregular  Pulmonary/Chest: Effort normal.  Abdominal: Bowel sounds are normal.  Musculoskeletal: She exhibits no edema.  Neurological: She is alert and oriented to person, place, and  time.  Skin: Skin is warm and dry.  Nursing note and vitals reviewed.    ED Treatments / Results  Labs (all labs ordered are listed, but only abnormal results are displayed) Labs Reviewed  BASIC METABOLIC PANEL - Abnormal; Notable for the following components:      Result Value   Sodium 129 (*)    CO2 19 (*)    Glucose, Bld 122 (*)    BUN 25 (*)    Creatinine, Ser 1.14 (*)    Calcium 11.0 (*)    GFR calc non Af Amer 43 (*)    GFR calc Af Amer 50 (*)    All other components within normal limits  CBC - Abnormal; Notable for the following components:   MCV 103.3 (*)    MCH 34.3 (*)    All other components within normal limits  BRAIN NATRIURETIC PEPTIDE - Abnormal; Notable for the following components:   B Natriuretic Peptide 1,366.0 (*)    All other components within normal limits  TROPONIN I - Abnormal; Notable for the following components:   Troponin I 0.18 (*)    All other components within normal limits  MAGNESIUM  PROTIME-INR  APTT  TSH  LIPID PANEL  BASIC METABOLIC PANEL  TROPONIN I  HEPARIN LEVEL (UNFRACTIONATED)  CBC    EKG EKG Interpretation  Date/Time:  Monday December 13 2017 15:46:35 EST Ventricular Rate:  125 PR  Interval:    QRS Duration: 155 QT Interval:  338 QTC Calculation: 488 R Axis:   -90 Text Interpretation:  Atrial fibrillation Left bundle branch block afib with aberancy No acute changes Confirmed by Derwood Kaplan ([id number]) on 12/31/2017 3:57:48 PM   Radiology Dg Chest Port 1 View  Result Date: 12/27/2017 CLINICAL DATA:  Atrial fibrillation. EXAM: PORTABLE CHEST 1 VIEW COMPARISON:  None. FINDINGS: Heart size is within normal limits considering the AP portable technique. Aortic atherosclerosis. Pulmonary vascularity is at the upper limits of normal. No infiltrates or effusions. No acute bone abnormality. IMPRESSION: No acute cardiopulmonary findings. Aortic atherosclerosis. Electronically Signed   By: Francene Boyers M.D.   On: 12/15/2017 16:29    Procedures .Critical Care Performed by: Derwood Kaplan, MD Authorized by: Derwood Kaplan, MD   Critical care provider statement:    Critical care time (minutes):  45   Critical care start time:  12/06/2017 3:55 PM   Critical care end time:  01/02/2018 5:55 PM   Critical care was necessary to treat or prevent imminent or life-threatening deterioration of the following conditions:  Cardiac failure and circulatory failure   Critical care was time spent personally by me on the following activities:  Discussions with consultants, evaluation of patient's response to treatment, examination of patient, ordering and performing treatments and interventions, ordering and review of laboratory studies, ordering and review of radiographic studies, pulse oximetry, re-evaluation of patient's condition, obtaining history from patient or surrogate and review of old charts   I assumed direction of critical care for this patient from another provider in my specialty: yes     (including critical care time)  Medications Ordered in ED Medications  heparin bolus via infusion 4,000 Units (4,000 Units Intravenous Bolus from Bag 12/10/2017 1702)    Followed by  heparin  ADULT infusion 100 units/mL (25000 units/259mL sodium chloride 0.45%) (800 Units/hr Intravenous Transfusing/Transfer 12/19/2017 1700)  atorvastatin (LIPITOR) tablet 40 mg (has no administration in time range)  levothyroxine (SYNTHROID, LEVOTHROID) tablet 88 mcg (has no administration in time range)  pantoprazole (PROTONIX)  EC tablet 40 mg (has no administration in time range)  clopidogrel (PLAVIX) tablet 75 mg (has no administration in time range)  cholecalciferol (VITAMIN D3) tablet 2,000 Units (has no administration in time range)  fluticasone (FLONASE) 50 MCG/ACT nasal spray 1 spray (has no administration in time range)  acetaminophen (TYLENOL) tablet 650 mg (has no administration in time range)  ondansetron (ZOFRAN) injection 4 mg (has no administration in time range)  metoprolol tartrate (LOPRESSOR) tablet 25 mg (has no administration in time range)  metoprolol tartrate (LOPRESSOR) injection 2.5 mg (2.5 mg Intravenous Given 12/09/2017 1642)  metoprolol tartrate (LOPRESSOR) tablet 25 mg (25 mg Oral Given 01/02/2018 1646)  sodium chloride 0.9 % bolus 500 mL (0 mLs Intravenous Stopped 01/01/2018 1759)  acetaminophen (TYLENOL) tablet 650 mg (650 mg Oral Given 12/23/2017 2019)     Initial Impression / Assessment and Plan / ED Course  I have reviewed the triage vital signs and the nursing notes.  Pertinent labs & imaging results that were available during my care of the patient were reviewed by me and considered in my medical decision making (see chart for details).  Clinical Course as of Dec 14 8  Mon Dec 13, 2017  1737 Patient reassessed.  Labs overall are reassuring besides elevated BNP. Patient's blood pressure is in the 90s systolic.  Her heart rate after 2.5 mg IV metoprolol and 25 mg oral metoprolol is now around 110.  We will call hospitalist for admission.  Cardiology is also been consulted.   [AN]  1753 Spoke with Dr. Mayford Knife, cardiology. Given that patient has CHF with BNP of 1300 and she  did not have significant response to fluid bolus on her blood pressure, she is willing to accept the patient to [facility] under cardiology service. At the moment patient is not requiring any diltiazem drip or amiodarone drip, therefore [facility] is requesting a telemetry bed.  If patient requires IV drips, we will speak with [facility] and update the bed request. Medicine team has been made aware of the change. Family updated.   [AN]    Clinical Course User Index [AN] Derwood Kaplan, MD    84 year old woman comes in with chief complaint of weakness.  She is noted to have new onset A. Fib.  Based on history and exam it does not appear that there is underlying infectious process and the suspicion for PE is low.  TSH ordered to ensure there is no thyroid issues along with CBC to ensure there is no anemia.  Patient had recent bout of diarrhea, therefore we will look at her electrolytes closely.  Currently does not appear that she is in decompensated CHF. Patient is already taking metoprolol.  Her heart rate is noted mostly to be in the 120s and below range -therefore we will give her IV metoprolol and oral metoprolol.  Her blood pressure is in the 90 systolic so there is not a lot of room for diltiazem at this time.  This patients CHA2DS2-VASc Score and unadjusted Ischemic Stroke Rate (% per year) is equal to 7.2 % stroke rate/year from a score of 5  Above score calculated as 1 point each if present [CHF, HTN, DM, Vascular=MI/PAD/Aortic Plaque, Age if 65-74, or Female] Above score calculated as 2 points each if present [Age > 75, or Stroke/TIA/TE]     Final Clinical Impressions(s) / ED Diagnoses   Final diagnoses:  Atrial fibrillation with [facility])    ED Discharge Orders  Ordered    Amb referral to AFIB Clinic     01-09-18 1600           Derwood Kaplan, MD January 09, 2018 1740    Derwood Kaplan, MD 12/29/2017 0010

## 2017-12-14 ENCOUNTER — Encounter (HOSPITAL_COMMUNITY): Payer: Self-pay | Admitting: General Practice

## 2017-12-14 DIAGNOSIS — J9601 Acute respiratory failure with hypoxia: Secondary | ICD-10-CM

## 2017-12-14 DIAGNOSIS — Z515 Encounter for palliative care: Secondary | ICD-10-CM

## 2017-12-14 DIAGNOSIS — I4891 Unspecified atrial fibrillation: Secondary | ICD-10-CM

## 2017-12-14 DIAGNOSIS — I469 Cardiac arrest, cause unspecified: Secondary | ICD-10-CM

## 2017-12-14 DIAGNOSIS — I5043 Acute on chronic combined systolic (congestive) and diastolic (congestive) heart failure: Secondary | ICD-10-CM

## 2017-12-14 DIAGNOSIS — I2510 Atherosclerotic heart disease of native coronary artery without angina pectoris: Secondary | ICD-10-CM

## 2017-12-14 DIAGNOSIS — I255 Ischemic cardiomyopathy: Secondary | ICD-10-CM

## 2017-12-14 DIAGNOSIS — Z7189 Other specified counseling: Secondary | ICD-10-CM

## 2017-12-14 LAB — BASIC METABOLIC PANEL
Anion gap: 13 (ref 5–15)
Anion gap: 15 (ref 5–15)
BUN: 28 mg/dL — ABNORMAL HIGH (ref 8–23)
BUN: 30 mg/dL — AB (ref 8–23)
CHLORIDE: 96 mmol/L — AB (ref 98–111)
CO2: 16 mmol/L — ABNORMAL LOW (ref 22–32)
CO2: 18 mmol/L — AB (ref 22–32)
CREATININE: 1.36 mg/dL — AB (ref 0.44–1.00)
Calcium: 10.4 mg/dL — ABNORMAL HIGH (ref 8.9–10.3)
Calcium: 10.9 mg/dL — ABNORMAL HIGH (ref 8.9–10.3)
Chloride: 97 mmol/L — ABNORMAL LOW (ref 98–111)
Creatinine, Ser: 1.55 mg/dL — ABNORMAL HIGH (ref 0.44–1.00)
GFR calc Af Amer: 40 mL/min — ABNORMAL LOW (ref >60)
GFR calc Af Amer: 34 mL/min — ABNORMAL LOW (ref >60)
GFR calc non Af Amer: 35 mL/min — ABNORMAL LOW (ref >60)
GFR calc non Af Amer: 30 mL/min — ABNORMAL LOW (ref >60)
Glucose, Bld: 152 mg/dL — ABNORMAL HIGH (ref 70–99)
Glucose, Bld: 140 mg/dL — ABNORMAL HIGH (ref 70–99)
Potassium: 5.6 mmol/L — ABNORMAL HIGH (ref 3.5–5.1)
Potassium: 5.6 mmol/L — ABNORMAL HIGH (ref 3.5–5.1)
Sodium: 127 mmol/L — ABNORMAL LOW (ref 135–145)
Sodium: 128 mmol/L — ABNORMAL LOW (ref 135–145)

## 2017-12-14 LAB — CBC
HCT: 41.1 % (ref 36.0–46.0)
HEMATOCRIT: 43.6 % (ref 36.0–46.0)
HEMOGLOBIN: 13.2 g/dL (ref 12.0–15.0)
Hemoglobin: 14.5 g/dL (ref 12.0–15.0)
MCH: 33.2 pg (ref 26.0–34.0)
MCH: 34.3 pg — AB (ref 26.0–34.0)
MCHC: 32.1 g/dL (ref 30.0–36.0)
MCHC: 33.3 g/dL (ref 30.0–36.0)
MCV: 103.3 fL — ABNORMAL HIGH (ref 80.0–100.0)
MCV: 103.1 fL — ABNORMAL HIGH (ref 80.0–100.0)
Platelets: 153 10*3/uL (ref 150–400)
Platelets: 138 10*3/uL — ABNORMAL LOW (ref 150–400)
RBC: 4.23 MIL/uL (ref 3.87–5.11)
RBC: 3.98 MIL/uL (ref 3.87–5.11)
RDW: 13.7 % (ref 11.5–15.5)
RDW: 13.8 % (ref 11.5–15.5)
WBC: 6.4 10*3/uL (ref 4.0–10.5)
WBC: 6.2 10*3/uL (ref 4.0–10.5)
nRBC: 0 % (ref 0.0–0.2)
nRBC: 0 % (ref 0.0–0.2)

## 2017-12-14 LAB — LIPID PANEL
CHOL/HDL RATIO: 2.7 ratio
Cholesterol: 108 mg/dL (ref 0–200)
HDL: 40 mg/dL — ABNORMAL LOW (ref >40)
LDL Cholesterol: 60 mg/dL (ref 0–99)
Triglycerides: 40 mg/dL (ref <150)
VLDL: 8 mg/dL (ref 0–40)

## 2017-12-14 LAB — MRSA PCR SCREENING: MRSA by PCR: NEGATIVE

## 2017-12-14 LAB — HEPARIN LEVEL (UNFRACTIONATED): HEPARIN UNFRACTIONATED: 0.96 [IU]/mL — AB (ref 0.30–0.70)

## 2017-12-14 LAB — GLUCOSE, CAPILLARY: Glucose-Capillary: 118 mg/dL — ABNORMAL HIGH (ref 70–99)

## 2017-12-14 LAB — TROPONIN I: Troponin I: 0.17 ng/mL (ref <0.03)

## 2017-12-14 MED ORDER — MORPHINE BOLUS VIA INFUSION
5.0000 mg | INTRAVENOUS | Status: DC | PRN
  Filled 2017-12-14: qty 5

## 2017-12-14 MED ORDER — NOREPINEPHRINE 4 MG/250ML-% IV SOLN: 0.0000 ug/min | INTRAVENOUS | Status: DC

## 2017-12-14 MED ORDER — DOPAMINE-DEXTROSE 3.2-5 MG/ML-% IV SOLN
2.5000 ug/kg/min | INTRAVENOUS | Status: DC
  Administered 2017-12-14: 2.5 ug/kg/min via INTRAVENOUS
  Filled 2017-12-14: qty 250

## 2017-12-14 MED ORDER — NOREPINEPHRINE 4 MG/250ML-% IV SOLN
INTRAVENOUS | Status: AC
  Administered 2017-12-14: 10 ug/min
  Filled 2017-12-14: qty 250

## 2017-12-14 MED ORDER — SODIUM CHLORIDE 0.9 % IV SOLN: Freq: Once | INTRAVENOUS | Status: DC

## 2017-12-14 MED ORDER — MORPHINE SULFATE (PF) 2 MG/ML IV SOLN
2.0000 mg | INTRAVENOUS | Status: DC | PRN
  Administered 2017-12-14: 5 mg via INTRAVENOUS

## 2017-12-14 MED ORDER — AMIODARONE LOAD VIA INFUSION
150.0000 mg | Freq: Once | INTRAVENOUS | Status: AC
  Administered 2017-12-14: 150 mg via INTRAVENOUS
  Filled 2017-12-14: qty 83.34

## 2017-12-14 MED ORDER — MORPHINE SULFATE (PF) 2 MG/ML IV SOLN
INTRAVENOUS | Status: AC
  Administered 2017-12-14: 2 mg via INTRAVENOUS
  Filled 2017-12-14: qty 5

## 2017-12-14 MED ORDER — SODIUM CHLORIDE 0.9% FLUSH: 3.0000 mL | INTRAVENOUS | Status: DC | PRN

## 2017-12-14 MED ORDER — POLYVINYL ALCOHOL 1.4 % OP SOLN
1.0000 [drp] | Freq: Four times a day (QID) | OPHTHALMIC | Status: DC | PRN
  Filled 2017-12-14: qty 15

## 2017-12-14 MED ORDER — GLYCOPYRROLATE 0.2 MG/ML IJ SOLN: 0.2000 mg | INTRAMUSCULAR | Status: DC | PRN

## 2017-12-14 MED ORDER — MORPHINE SULFATE (PF) 2 MG/ML IV SOLN: 2.0000 mg | Freq: Once | INTRAVENOUS | Status: DC

## 2017-12-14 MED ORDER — MORPHINE SULFATE (PF) 4 MG/ML IV SOLN
INTRAVENOUS | Status: AC
  Administered 2017-12-14: 4 mg
  Filled 2017-12-14: qty 1

## 2017-12-14 MED ORDER — APIXABAN 5 MG PO TABS
5.0000 mg | ORAL_TABLET | Freq: Two times a day (BID) | ORAL | Status: DC
  Administered 2017-12-14: 5 mg via ORAL
  Filled 2017-12-14: qty 1

## 2017-12-14 MED ORDER — APIXABAN 2.5 MG PO TABS: 2.5000 mg | ORAL_TABLET | Freq: Two times a day (BID) | ORAL | Status: DC

## 2017-12-14 MED ORDER — AMIODARONE HCL IN DEXTROSE 360-4.14 MG/200ML-% IV SOLN
60.0000 mg/h | INTRAVENOUS | Status: DC
  Administered 2017-12-14: 60 mg/h via INTRAVENOUS
  Filled 2017-12-14: qty 200

## 2017-12-14 MED ORDER — MORPHINE SULFATE (PF) 2 MG/ML IV SOLN: 2.0000 mg | INTRAVENOUS | Status: DC | PRN

## 2017-12-14 MED ORDER — ACETAMINOPHEN 325 MG PO TABS: 650.0000 mg | ORAL_TABLET | Freq: Four times a day (QID) | ORAL | Status: DC | PRN

## 2017-12-14 MED ORDER — SODIUM CHLORIDE 0.9% FLUSH: 3.0000 mL | Freq: Two times a day (BID) | INTRAVENOUS | Status: DC

## 2017-12-14 MED ORDER — DIPHENHYDRAMINE HCL 50 MG/ML IJ SOLN: 25.0000 mg | INTRAMUSCULAR | Status: DC | PRN

## 2017-12-14 MED ORDER — AMIODARONE HCL IN DEXTROSE 360-4.14 MG/200ML-% IV SOLN: 30.0000 mg/h | INTRAVENOUS | Status: DC

## 2017-12-14 MED ORDER — MORPHINE 100MG IN NS 100ML (1MG/ML) PREMIX INFUSION
0.0000 mg/h | INTRAVENOUS | Status: DC
  Filled 2017-12-14 (×3): qty 100

## 2017-12-14 MED ORDER — GLYCOPYRROLATE 1 MG PO TABS: 1.0000 mg | ORAL_TABLET | ORAL | Status: DC | PRN

## 2017-12-14 MED ORDER — MORPHINE SULFATE (PF) 2 MG/ML IV SOLN
INTRAVENOUS | Status: AC
  Administered 2017-12-14: 2 mg via INTRAMUSCULAR
  Filled 2017-12-14: qty 1

## 2017-12-14 MED ORDER — DEXTROSE 5 % IV SOLN: INTRAVENOUS | Status: DC

## 2017-12-14 MED ORDER — ACETAMINOPHEN 650 MG RE SUPP: 650.0000 mg | Freq: Four times a day (QID) | RECTAL | Status: DC | PRN

## 2017-12-14 MED ORDER — METOPROLOL TARTRATE 12.5 MG HALF TABLET
12.5000 mg | ORAL_TABLET | Freq: Two times a day (BID) | ORAL | Status: DC
  Administered 2017-12-14: 12.5 mg via ORAL
  Filled 2017-12-14: qty 1

## 2017-12-14 MED ORDER — MORPHINE SULFATE (PF) 10 MG/ML IV SOLN
10.0000 mg | Freq: Once | INTRAVENOUS | Status: DC
  Filled 2017-12-14: qty 1

## 2017-12-15 ENCOUNTER — Other Ambulatory Visit (HOSPITAL_COMMUNITY): Payer: Medicare Other

## 2017-12-15 SURGERY — ECHOCARDIOGRAM, TRANSESOPHAGEAL
Anesthesia: Monitor Anesthesia Care

## 2017-12-16 ENCOUNTER — Telehealth: Payer: Self-pay | Admitting: Cardiovascular Disease

## 2017-12-16 MED FILL — Medication: Qty: 1 | Status: AC

## 2017-12-16 NOTE — Telephone Encounter (Signed)
They will call pcp, Dr.Shah to sign death certificate

## 2017-12-16 NOTE — Telephone Encounter (Signed)
New Message   Lupita Leash form [employer] is calling wanting to know if Sr. Purvis Sheffield will be signing the pt's death certificate. Please call

## 2017-12-27 ENCOUNTER — Ambulatory Visit: Payer: Medicare Other | Admitting: Cardiology

## 2018-01-05 NOTE — Progress Notes (Signed)
   85 y/o woman with multiple medical problems. Admitted with AF. Found to have low EF.   Developed profound bradycardia with altered mental status.   Moved to ICU. Seen by CCM who paged me.   On my arrival developed asystole and apnea. Intubated and give epi. Had CPR.   Family arrived and said she would not want heroic measures. As she was asystolic, ETT removed.   Patient then developed agonal breathing with a pulse in the 30s due to epi.   Discussed with family and they agree with comfort care. Patient given morphine due to her agona respirations and passed at 15:03.  CRITICAL CARE Performed by: Arvilla Meres  Total critical care time: 45 minutes  Critical care time was exclusive of separately billable procedures and treating other patients.  Critical care was necessary to treat or prevent imminent or life-threatening deterioration.  Critical care was time spent personally by me (independent of midlevel providers or residents) on the following activities: development of treatment plan with patient and/or surrogate as well as nursing, discussions with consultants, evaluation of patient's response to treatment, examination of patient, obtaining history from patient or surrogate, ordering and performing treatments and interventions, ordering and review of laboratory studies, ordering and review of radiographic studies, pulse oximetry and re-evaluation of patient's condition.  Arvilla Meres, MD  3:18 PM

## 2018-01-05 NOTE — Procedures (Signed)
CPR  Patient became PEA and was intubated.  Code was stopped at the family's request.  Please see code sheet for detail and see CCM note for details of occurences.  Patient was declared at 3:03 PM  Alyson Reedy, M.D. [facility] [place] Pulmonary/Critical Care Medicine. Pager: [phone]. After hours pager: [phone].

## 2018-01-05 NOTE — Progress Notes (Signed)
ANTICOAGULATION CONSULT NOTE  Pharmacy Consult for Heparin Indication: atrial fibrillation  Allergies  Allergen Reactions  . Hctz [Hydrochlorothiazide]     Possible high calcium   . Lescol Xl [Fluvastatin]     Reflux   . Sulfa Antibiotics     abd upset     Patient Measurements: Height: 5\' 3"  (160 cm) Weight: 147 lb 14.9 oz (67.1 kg) IBW/kg (Calculated) : 52.4 HEPARIN DW (KG): 66  Vital Signs: Temp: 97 F (36.1 C) (12/09 2320) Temp Source: Oral (12/09 2320) BP: 92/60 (12/10 0041) Pulse Rate: 114 (12/10 0041)  Labs: Recent Labs    December 25, 2017 1600 12/12/2017 0036  HGB 14.7 13.2  HCT 44.2 41.1  PLT 172 153  APTT 32  --   LABPROT 14.2  --   INR 1.11  --   HEPARINUNFRC  --  0.96*  CREATININE 1.14*  --   TROPONINI 0.18*  --     Estimated Creatinine Clearance: 31.4 mL/min (A) (by C-G formula based on SCr of 1.14 mg/dL (H)).  Assessment: 85 y.o. female with Afib for heparin  Goal of Therapy:  Heparin level 0.3-0.7 units/ml Monitor platelets by anticoagulation protocol: Yes   Plan:  Decrease Heparin 650 units/hr Check heparin level in 8 hours.   Geannie Risen, PharmD, BCPS

## 2018-01-05 NOTE — Progress Notes (Addendum)
Progress Note  Patient Name: Danielle Bell Date of Encounter: 12/23/2017  Primary Cardiologist: Prentice Docker, MD   Subjective   Denies any CP.  Complains of severe anxiety and feeling hot and cold.  No CP.  Has some intermittent SOB  Inpatient Medications    Scheduled Meds: . atorvastatin  40 mg Oral q morning - 10a  . cholecalciferol  2,000 Units Oral q morning - 10a  . clopidogrel  75 mg Oral Q breakfast  . fluticasone  1 spray Each Nare q morning - 10a  . levothyroxine  88 mcg Oral QAC breakfast  . metoprolol tartrate  25 mg Oral BID  . pantoprazole  40 mg Oral q morning - 10a   Continuous Infusions: . heparin 650 Units/hr (01/04/2018 0204)   PRN Meds: acetaminophen, ondansetron (ZOFRAN) IV   Vital Signs    Vitals:   12/16/2017 0407 12/28/2017 0410 12/12/2017 0445 12/05/2017 0454  BP: (!) 94/52 (!) 86/52 (!) 80/60 99/87  Pulse: (!) 120     Resp: 18     Temp: (!) 97 F (36.1 C)     TempSrc:      SpO2: 95%     Weight:      Height:        Intake/Output Summary (Last 24 hours) at 12/19/2017 0808 Last data filed at 12/10/2017 0600 Gross per 24 hour  Intake 876.64 ml  Output -  Net 876.64 ml   Filed Weights   01/01/18 1545  Weight: 67.1 kg    Telemetry    Atrial fibrillation - Personally Reviewed  ECG   Atrial fibrillation with RVR and LBBB- Personally Reviewed  Physical Exam   GEN: No acute distress.   Neck: No JVD Cardiac: irregularly irregular, no murmurs, rubs, or gallops.  Respiratory: Clear to auscultation bilaterally. GI: Soft, nontender, non-distended  MS: No edema; No deformity. Neuro:  Nonfocal  Psych: Normal affect   Labs    Chemistry Recent Labs  Lab January 01, 2018 1600 12/08/2017 0036  NA 129* 127*  K 5.0 5.6*  CL 98 96*  CO2 19* 18*  GLUCOSE 122* 152*  BUN 25* 28*  CREATININE 1.14* 1.36*  CALCIUM 11.0* 10.4*  GFRNONAA 43* 35*  GFRAA 50* 40*  ANIONGAP 12 13     Hematology Recent Labs  Lab Jan 01, 2018 1600 12/10/2017 0036    WBC 5.5 6.4  RBC 4.28 3.98  HGB 14.7 13.2  HCT 44.2 41.1  MCV 103.3* 103.3*  MCH 34.3* 33.2  MCHC 33.3 32.1  RDW 13.8 13.7  PLT 172 153    Cardiac Enzymes Recent Labs  Lab 2018-01-01 1600 12/17/2017 0036  TROPONINI 0.18* 0.17*   No results for input(s): TROPIPOC in the last 168 hours.   BNP Recent Labs  Lab 01-Jan-2018 1600  BNP 1,366.0*     DDimer No results for input(s): DDIMER in the last 168 hours.   Radiology    Dg Chest Port 1 View  Result Date: 01/01/18 CLINICAL DATA:  Atrial fibrillation. EXAM: PORTABLE CHEST 1 VIEW COMPARISON:  None. FINDINGS: Heart size is within normal limits considering the AP portable technique. Aortic atherosclerosis. Pulmonary vascularity is at the upper limits of normal. No infiltrates or effusions. No acute bone abnormality. IMPRESSION: No acute cardiopulmonary findings. Aortic atherosclerosis. Electronically Signed   By: Francene Boyers M.D.   On: Jan 01, 2018 16:29    Cardiac Studies   2D echo 09/28/2017 Study Conclusions  - Left ventricle: The cavity size was mildly dilated. There was  moderate focal basal hypertrophy. Systolic function was severely   reduced. The estimated ejection fraction was in the range of 25%   to 30%. Wall motion was normal; there were no regional wall   motion abnormalities. The study was not technically sufficient to   allow evaluation of LV diastolic dysfunction due to atrial   fibrillation. Doppler parameters are consistent with high   ventricular filling pressure. - Aortic valve: There was mild to moderate regurgitation. Valve   area (VTI): 4.04 cm^2. Valve area (Vmax): 3.14 cm^2. Valve area   (Vmean): 3.02 cm^2. - Mitral valve: There was moderate to severe regurgitation likely   related to annular dilatation, - Left atrium: The atrium was mildly dilated. - Right ventricle: The cavity size was mildly dilated. Wall   thickness was normal. - Right atrium: The atrium was moderately dilated. - Tricuspid  valve: There was mild regurgitation. - Pulmonic valve: There was mild regurgitation. - Pulmonary arteries: PA peak pressure: 56 mm Hg (S).  Impressions:  - The findings indicate significant septal-lateral left ventricular   wall dyssynchrony. The right ventricular systolic pressure was   increased consistent with moderate pulmonary hypertension.  Cardiac Cath 09/27/2017  Prox RCA to Mid RCA lesion is 25% stenosed.  Mid LAD lesion is 75% stenosed. Attmepted PCI but unable to cross with a wire due to severe toruosity.  Prox Cx to Mid Cx lesion is 25% stenosed.  LV end diastolic pressure is mildly elevated.  There is no aortic valve stenosis.  Hemodynamic findings consistent with moderate pulmonary hypertension. Mean PA pressure 42 mm Hg.  CO 5.7 L/min; CI 3.3; PA 63/27, mean PCWP 42 mm Hg; Ao sat 95%, PA sat 72%   PCI 09/28/2017  Prox Cx to Mid Cx lesion is 25% stenosed.  Mid LAD lesion is 75% stenosed.  A drug-eluting stent was successfully placed using a STENT RESOLUTE ONYX 2.0X26.  Post intervention, there is a 0% residual stenosis.  Ost 2nd Diag to 2nd Diag lesion is 75% stenosed.  Balloon angioplasty was performed using a BALLOON SAPPHIRE 2.0X12.  Post intervention, there is a 40% residual stenosis.  Patient Profile     85 y.o. female  with history of heart failure with reduced ejection fraction (LVEF=25-30%), coronary artery disease (status-post PCI to the LAD 09/2017), hypertension, left bundle branch block, who presents with three days of fatigue, dyspnea, decreased appetite, and palpitations found to be in atrial fibrillation with rapid ventricular response.  Assessment & Plan    1. Atrial fibrillation with rapid ventricular response. She presented with new-onset sustained episode of AF by history that has led to acute on chronic systolic heart failure exacerbation. Anticoagulation will be initiated to prevent stroke based on her CHA2DS2-VASc score of 6 (age > 32  (+2), hypertension (+1), vascular disease (+1), female gender (+1), heart failure (+1)) -currently on IV Heparin gtt.  Will transition to Eliquis 5mg  BID (weight > 60kg, age > 80 and creatinine < 1.5) -ASA stopped (high risk of bleeding complications on triple therapy in this 85 yo female) -Continue clopidogrel -decrease lopressor to 12.5mg  BID due to soft BP -HR still in the 120's at times.  Will start IV Amio gtt for better rate control and inability to titrate BB due to soft BP -avoid CCB in the setting of LV dysfunction -NPO @ [facility] tonight for TEE/DCCV tomorrow - needs 3 doses of Eliquis prior to TEE/DCCV  2. Acute on chronic combined systolic/diastolic HF. Worsening HF symptoms precipitated by AF.  -BNP  1366 but Cxray with no edema -Got Lasix 20mg  IV last night -she put out 876cc and is net + 876. -weight 147lbs on admit (148lbs at last OV 10/08/2017) -EF 25-30% by echo 09/2017 prior to revascularization -Metoprolol tartrate as above; plan on consolidating to metoprolol succinate prior to discharge  -she has not tolerated ACE I/ARB/Entresto in the past due to hypotension -hold further diuretics for now with increased creatinine and no obvious signs of volume overload on exam. -HFrEF with wide QRS LBBB, could consider CRT-P if still has CHF sx and reduced EF after she is on maximum guideline directed therapy for CHF -repeat 2D echo to see if LVF has improved s/p revascularization of LAD and D2 in September.  Will do this after restoring NSR.  3. Coronary artery disease. Troponin abnormal; suspect demand-mediated with acute HF and AF+RVR.  -cath 09/27/2017 showed 25% pRCA, 75% mLAD, 25% pLCx, 75% oD2 and elevated LVEDP with moderate pulmonary HTN PASP . -s/p PCI of the RCA and PTCA of oD2 on 9/24/29019 -Continue clopidogrel and atorvastatin 40 -ASA stopped due to need for DOAC -Serial troponins.  Flat trop elevation which is not c/w ACS and more consistent with demand ischemia from  afib with RVR and CHF.   4.  Moderate to severe MR -likely related to annular dilatation -given advanced age may not be a candidate for mitraclip  5. Hypothyroidism -Continue levothyroxine -TSH ok at 1.76  6.  Acute on CKD stage 3 -creatinine has bumped to 1.36 from 1.13 in 09/2017.   -likely related to acute CHF  I have spent a total of 40 minutes with patient reviewing cardiac cath and echo , telemetry, EKGs, labs and examining patient as well as establishing an assessment and plan that was discussed with the patient.  > 50% of time was spent in direct patient care.    For questions or updates, please contact [facility] Please consult [email or web address] for contact info under Cardiology/STEMI.      Signed, Armanda Magic, MD  01-03-2018, 8:08 AM

## 2018-01-05 NOTE — Progress Notes (Signed)
RN notified by lab tech of unsuccessful attempts to collect blood for labs.

## 2018-01-05 NOTE — Discharge Summary (Addendum)
  Advanced Heart Failure Death Summary  Death Summary   Patient ID: Danielle Bell MRN: [id number], DOB/AGE: 06-15-1927 85 y.o. Admit date: 12/17/17 D/C date:     12/102019    Primary Discharge Diagnoses:  1. PEA  2. A fib RVR 3. A/C Systolic Heart Failure  4. CAD 5. Hypothyroidism 6. GERD    Hospital Course:  Danielle Bell was an 85 y.o. female with history of heart failure with reduced ejection fraction (LVEF=25-30%), coronary artery disease (status-post PCI to the LAD 09/2017), hypertension, and left bundle branch block. She presented with three days of fatigue, dyspnea, decreased appetite, and palpitations found to be in atrial fibrillation with rapid ventricular response.  In the ED she was in A fib RVR and had A/C systolic heart failure. She diuresed with IV lasix and started on IV heparin. She was set up for TEE and DC-CV the following day.   Unfortunately the next day she decompensated and had hypotension with sbp in the 60s. She was given NS bolus and started on dopamine drip. She was transferred to ICU and continued to decline.  She became lethargic developed cardiac arrest.  CPR started and she was given epinephrine. CCM urgently intubated. Once family arrived they did not want to pursue heroic measures. She was terminally  extubated and given morphine due to agonal respiration. She passed with family at that bedside on 12/10/2017 at 1503.   Duration of Discharge Encounter: Greater than 35 minutes   Signed, Tonye Becket, NP 12/17/2017, 2:49 PM  Agreed.  Arvilla Meres, MD  7:40 PM

## 2018-01-05 NOTE — Progress Notes (Signed)
Morphine 6 mg IVP given per verbal order from Dr Molli Knock at bedside for comfort/distress.

## 2018-01-05 NOTE — Progress Notes (Addendum)
   RN notified cardiology team that patient hypotensive and feeling poorly. She was started on amiodarone this morning for atrial fibrillation with RVR. HR improved to the 70s-80s, however patient with significant hypotension with SBPs in the 60s. She feels associated SOB. Patient also reported tongue numbness and left index finger numbness, otherwise normal neurologic exam. Possible this is related to hyperventilation, however given new onset atrial fibrillation, stroke is also on the differential. She is also requesting bowel regimen for management of constipation. She received a 250cc NS bolus with mild improvement in BP to the 70s. She has a history of systolic CHF but appears euvolemic on exam at this time with clear lungs.   Blood pressure (!) 79/61, pulse 66, temperature (!) 97 F (36.1 C), resp. rate 18, height 5\' 3"  (1.6 m), weight 67.1 kg, SpO2 91% on [place]  A/P: HR improved at this time so unclear why hypotension is persisting. - Will transfer the patient to ICU for pressors.  - Will start dopamine gtt 2.58mcg/kg/min and titrate as needed to maintain MAP of >60 - Will stop amiodarone at this time - Will evaluate focal neurologic deficits with a STAT CT head - Continue heparin gtt for stroke ppx - Will plan for TEE/DCCV tomorrow given intolerance of arrhythmia. Risks and benefits of transesophageal echocardiogram have been explained including risks of esophageal damage, perforation (1:10,000 risk), bleeding, pharyngeal hematoma as well as other potential complications associated with conscious sedation including aspiration, arrhythmia, respiratory failure and death. Alternatives to treatment were discussed, questions were answered. Patient is willing to proceed.    Plan discussed with Dr. Eustaquio Maize, PA-C December 25, 2017 1:20 PM

## 2018-01-05 NOTE — Progress Notes (Signed)
Patient arrived from 3E Patient responsive but lethargic and dry heaving. Noted Zofran given previously at [facility]. Dopamine started  Per order. Patient taken for stat CT head . BP dropped SBP 60`s upon arrival to radiology. Rapid response nurse called and dopamine titrated upwards. Patient returned to ICU accompanied by Rapid response and SWOT nurse. Patient coded upon arrival, CPR and medications given per ACLS protocol. Family updated and code was called off per family request.

## 2018-01-05 NOTE — Progress Notes (Signed)
Pt BP 79/43 pt alert oriented, family at the bedside. Rapid response aware. MD called awaiting on response

## 2018-01-05 NOTE — Care Management (Signed)
#    3.  S/W SIDNEY @ [facility]   # [phone]  APIXABAN: NONE FORMULARY  1. ELIQUIS  5 MG BID COVER- YES CO-PAY- $ 45.70 TIER- NO PRIOR APPROVAL- NO  2. ELIQUIS  2.5 MG BID COVER- YES CO-PAY- $ 45.71 TIER- NO PRIOR APPROVAL- NO  PREFERRED PHARMACY : YES [facility] AND [facility]

## 2018-01-05 NOTE — Progress Notes (Signed)
Called at 1415 to CT scan by RN who states patient needs head CT but BP is dropping and patient looks weak.  Arrived to find patient in bed - pale - extremely dry mucous membranes - skin cold and clammy - Dopamine drip at 7.5 mcg  - resps shallow - responds but very weak and lethargic - transported back to 3M03 with ICU RN - on arrival to unit patient becoming more lethargic - remains cold and clammy - PCCM NP to bedside - NS bolus started - IJ to left side per NP - Levophed started per order - patient becoming unresponsive and bradycardic - code started - see code blue sheet - Dr. Clarise Cruz to bedside - along with Dr. Molli Knock.

## 2018-01-05 NOTE — Progress Notes (Addendum)
ANTICOAGULATION CONSULT NOTE  Pharmacy Consult for Heparin -> Apixaban Indication: atrial fibrillation  Allergies  Allergen Reactions  . Hctz [Hydrochlorothiazide]     Possible high calcium   . Lescol Xl [Fluvastatin]     Reflux   . Sulfa Antibiotics     abd upset     Patient Measurements: Height: 5\' 3"  (160 cm) Weight: 147 lb 14.9 oz (67.1 kg) IBW/kg (Calculated) : 52.4 HEPARIN DW (KG): 66  Vital Signs: Temp: 97 F (36.1 C) (12/10 0407) Temp Source: Oral (12/09 2320) BP: 99/87 (12/10 0454) Pulse Rate: 120 (12/10 0407)  Labs: Recent Labs    12/23/2017 1600 12/24/2017 0036 December 24, 2017 0936  HGB 14.7 13.2  --   HCT 44.2 41.1  --   PLT 172 153  --   APTT 32  --   --   LABPROT 14.2  --   --   INR 1.11  --   --   HEPARINUNFRC  --  0.96*  --   CREATININE 1.14* 1.36* 1.55*  TROPONINI 0.18* 0.17*  --     Estimated Creatinine Clearance: 23.1 mL/min (A) (by C-G formula based on SCr of 1.55 mg/dL (H)).  Assessment: 85 y.o. female with new onset afib. Pt was started on heparin gtt and now pharmacy consult to change to apixaban. Pt meets criteria for 2.5mg  po BID dosing (weight > 60kg, age > 80 and creatinine > 1.5). Pt also on plavix. CBC stable.  Apixaban 5mg  po BID ordered originally and first dose given ~1000, SCr now up to 1.55 so qualifies for 2.5mg  po BID.  Noted plan for DCCV/TEE tomorrow.  Goal of Therapy:  Prevention of CVA Monitor platelets by anticoagulation protocol: Yes   Plan:  Apixaban 2.5mg  po BID Heparin was d/c after first dose of apixaban given F/u CBC  Christoper Fabian, PharmD, BCPS Clinical pharmacist  **Pharmacist phone directory can now be found on [email or web address] ([email or web address]).  Listed under [facility]. 2017-12-24 11:08 AM    Amiodarone Drug - Drug Interaction Consult Note  Recommendations: Continue to monitor closely Amiodarone is metabolized by the cytochrome P450 system and therefore has the potential to cause many drug interactions. Amiodarone has  an average plasma half-life of 50 days (range 20 to 100 days).   There is potential for drug interactions to occur several weeks or months after stopping treatment and the onset of drug interactions may be slow after initiating amiodarone.   [x]  Statins: Increased risk of myopathy. Simvastatin- restrict dose to 20mg  daily. Other statins: counsel patients to report any muscle pain or weakness immediately.  [x]  Beta blockers: increased risk of bradycardia, AV block and myocardial depression. Sotalol - avoid concomitant use.   Christoper Fabian, PharmD, BCPS Clinical pharmacist December 24, 2017 11:19 AM

## 2018-01-05 NOTE — Progress Notes (Signed)
Called by 3East RN with patient with decreased BP after Amiodarone bolus - BP 65/ systolic - RN reports patient is awake and mentation intact - no complaints just weak.MD was paged . This RN  unable to respond to bedside immediately due to initation of cardiac drip on med-surg floor.  Instructed RN to start NS bolus and repage MD.  Jeanene Erb to check on patient - BP responding to fluids - NP at bedside.  Recalled stating patient needed Dopamine for BP - for transfer to ICU.  This RN called SWOT RN to relieve me so I could go see patient.  On my arrival to 3E patient was rolling out of unit to ICU - she is alert - pale - cool and dry - walked to unit with patient - 3E staff continued to room - and reported to [facility] staff.

## 2018-01-05 NOTE — Progress Notes (Signed)
Called emergently bedside with the patient decompensating from a respiratory standpoint and developed cardiac arrest.  ACLS was started and patient was intubated.  During resuscitation Dr. Clarise Cruz spoke with the family and returned to the room and asked that we stop resuscitation.  Patient was asystolic and was declared and extubated.    Family came to the room.  Family visited and left.  Once family left the patient had ROSC again with hypotension and is agonally breathing.  I spoke with the family and after discussion, decided to proceed with comfort care and start morphine pushes for comfort.    Patient was given morphine pushes and expired at 3:03 and was declared.  Family was notified by Dr. Clarise Cruz.    Please see note sheet.  The patient is critically ill with multiple organ systems failure and requires high complexity decision making for assessment and support, frequent evaluation and titration of therapies, application of advanced monitoring technologies and extensive interpretation of multiple databases.   Critical Care Time devoted to patient care services described in this note is  32  Minutes. This time reflects time of care of this signee Dr Koren Bound. This critical care time does not reflect procedure time, or teaching time or supervisory time of PA/NP/Med student/Med Resident etc but could involve care discussion time.  Alyson Reedy, M.D. [facility] Pulmonary/Critical Care Medicine. Pager: [phone]. After hours pager: [phone].

## 2018-01-05 NOTE — Progress Notes (Signed)
PT. With BP 80/60. C/o cold sweats, pain under left breast. HR 160s non-sustained. Sustaining 130s, EKG completed. On call for Cardiology paged to make aware.

## 2018-01-05 NOTE — Procedures (Signed)
Intubation Procedure Note BETTEY MURAOKA [id number] 1927/03/27  Procedure: Intubation Indications: Respiratory insufficiency  Procedure Details Consent: Unable to obtain consent because of emergent medical necessity. Time Out: Verified patient identification, verified procedure, site/side was marked, verified correct patient position, special equipment/implants available, medications/allergies/relevent history reviewed, required imaging and test results available.  Performed  Maximum sterile technique was used including gloves, hand hygiene and mask.  MAC  Evaluation Hemodynamic Status: BP stable throughout; O2 sats: stable throughout Patient's Current Condition: stable Complications: No apparent complications Patient did tolerate procedure well.  YACOUB,WESAM 2018/01/03

## 2018-01-05 NOTE — Plan of Care (Signed)
  Problem: Safety: Goal: Ability to remain free from injury will improve Outcome: Progressing   Problem: Coping: Goal: Level of anxiety will decrease Outcome: Not Progressing   

## 2018-01-05 NOTE — Significant Event (Addendum)
Rapid Response Event Note  Overview: CP/Increased HR/Chills/Hot Sweats/Soft BPs  Initial Focused Assessment: Called by RN about patient having chest discomfort LT lateral chest (under her breast), that radiates into her back and her abdomen. Has been in AF and BP per RN have been soft. Per RN, HR was in the 160s, when I arrived with CARDS MD, HR was in the 110-120s AF. SBP > 110 and MAP > 65. Skin is cool touch. Lung sounds were clear in the upper fields and diminished in lower fields, overall good air movement. Maintaining oxygen saturations > 95% on 2L . Not in respiratory distress. Per patient pain is not consistent, at times hurts with pressure/inspiration and others times does not. Afebrile. Did received APAP for discomfort. + Heparin Infusion. Endorses inability to void and has difficulty urinating. Appears anxious.  Interventions: - EKG was done prior to my arrival - AF - Scheduled for TEE/Cardioversion today - STAT labs - UA   Plan of Care: - Rule out UTI - Monitor VS  Event Summary:    at    Call Time 445 Arrival Time 450 ( CARDS MD arrived at same time) End Time 0515  Kahlil Cowans R

## 2018-01-05 NOTE — Progress Notes (Signed)
On call for Cardiology, RR RN at pts. Bedside.

## 2018-01-05 DEATH — deceased

## 2018-01-06 ENCOUNTER — Ambulatory Visit: Payer: Medicare Other | Admitting: Student

## 2019-01-27 IMAGING — CT CT ABD-PELV W/O CM
2 of 4 series · 16 of 46 positions shown, 18 images · non-contrast
Comparison: 05/22/2016.

CLINICAL DATA: Unexplained anemia. Clinical concern for
retroperitoneal or thigh hematoma.

EXAM:
CT ABDOMEN AND PELVIS WITHOUT CONTRAST
TECHNIQUE: Multidetector CT imaging of the abdomen and pelvis was performed
following the standard protocol without IV contrast.

[Series 3: a/p w/o 5mm · axial · non-contrast · 0.98mm/px · z∈[+802,+1202]mm · 13 of 88 slices shown, 15 images]
[im 4/88  soft-tissue]
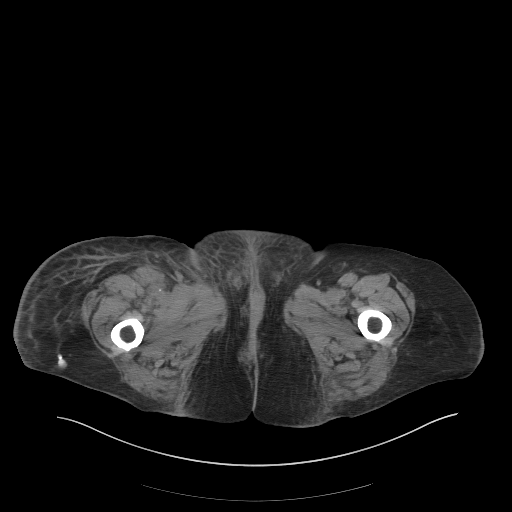
[im 4/88  bone]
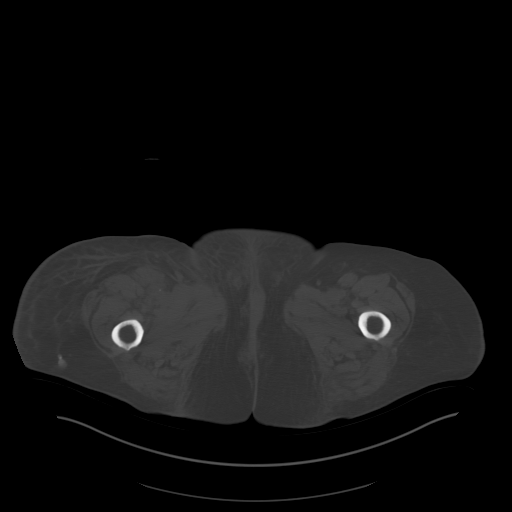
[im 11/88  soft-tissue]
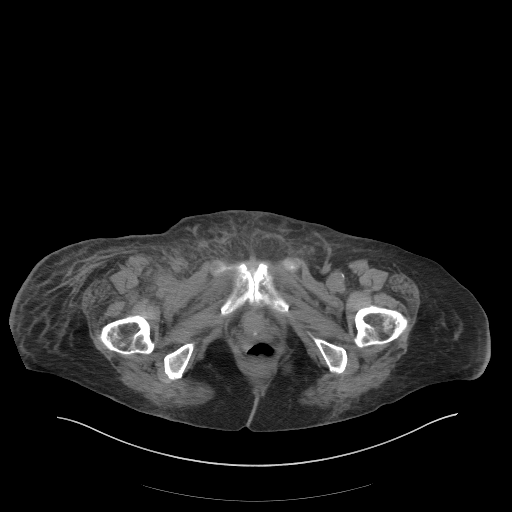
[im 18/88  soft-tissue]
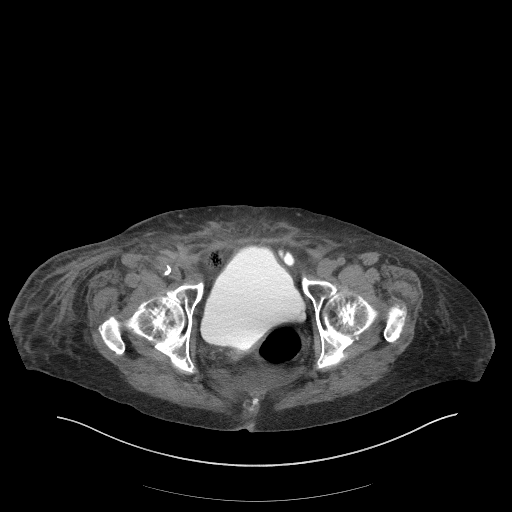
[im 25/88  soft-tissue]
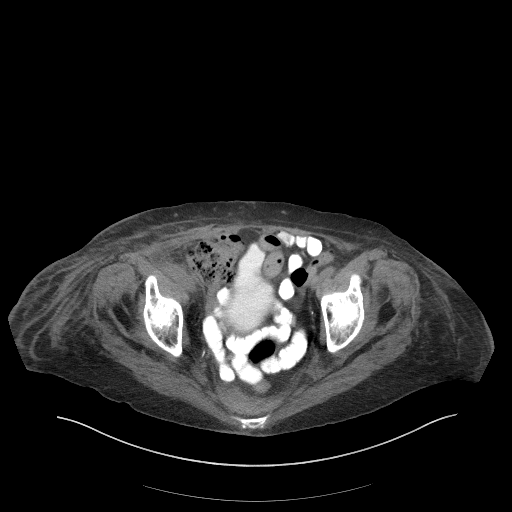
[im 32/88  soft-tissue]
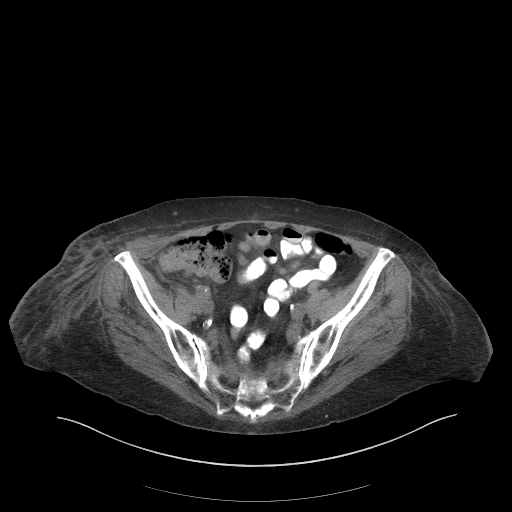
[im 39/88  soft-tissue]
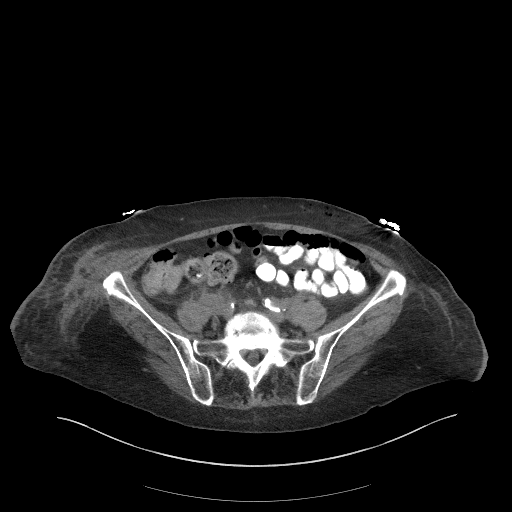
[im 46/88  soft-tissue]
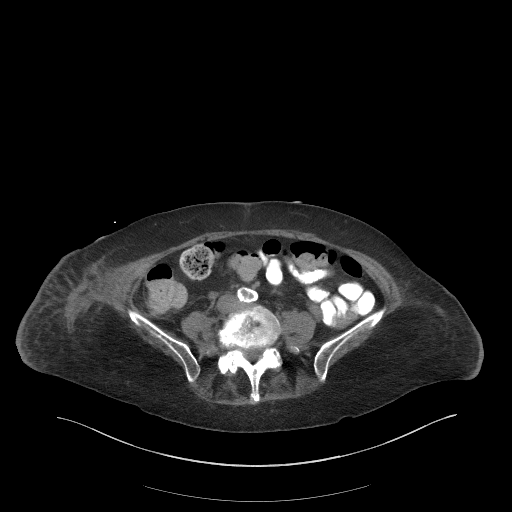
[im 49/88  soft-tissue]
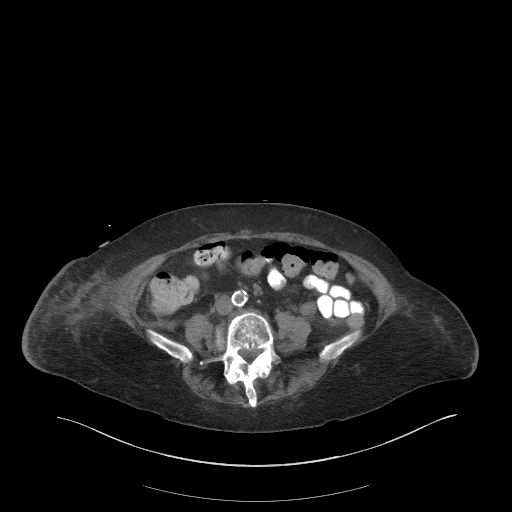
[im 56/88  soft-tissue]
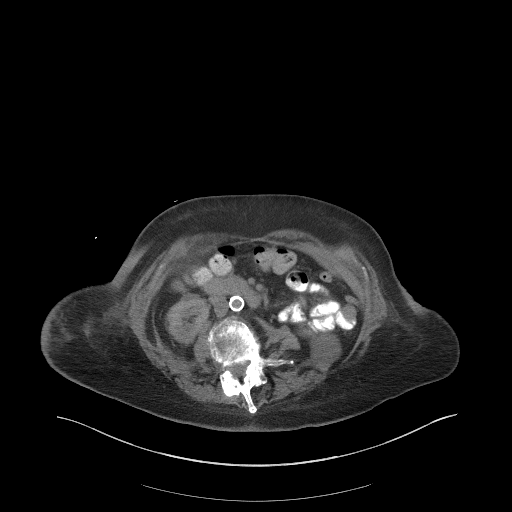
[im 56/88  bone]
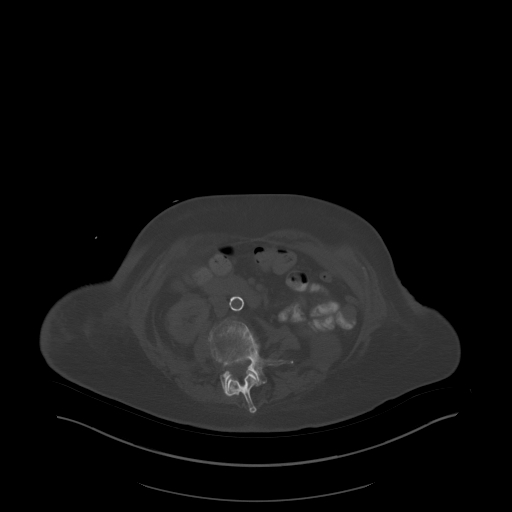
[im 63/88  soft-tissue]
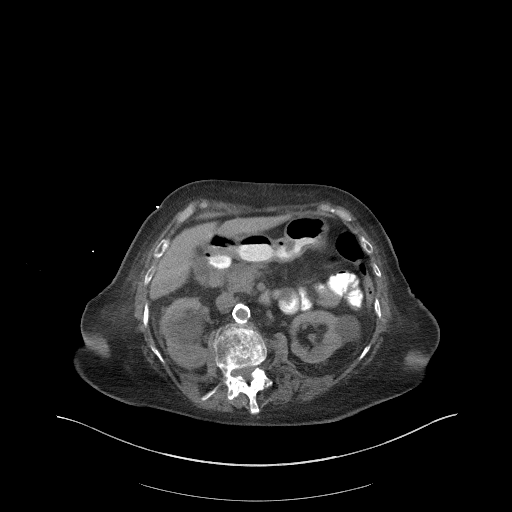
[im 70/88  soft-tissue]
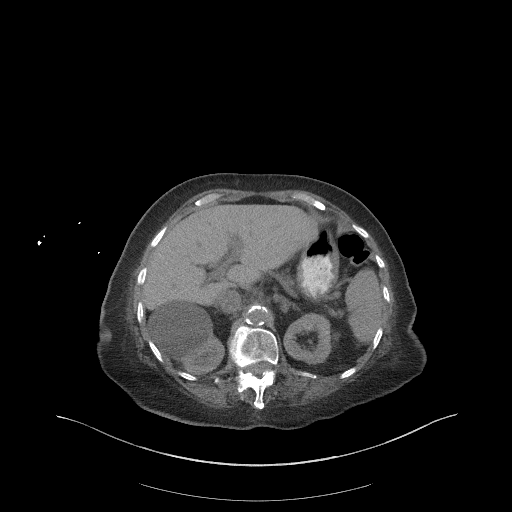
[im 77/88  soft-tissue]
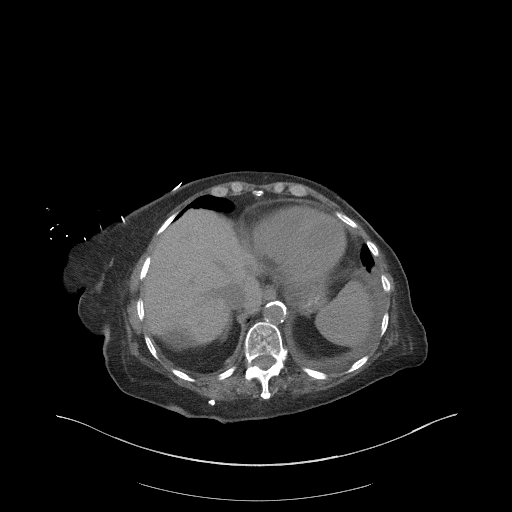
[im 84/88  soft-tissue]
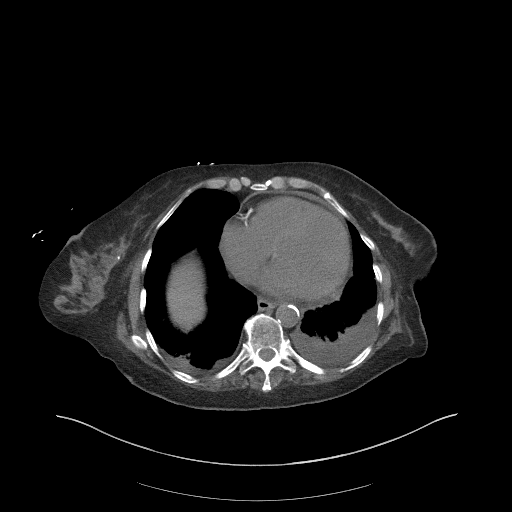

[Series 6: a/p w/o cor · coronal · non-contrast · 0.86mm/px · 3 of 131 slices shown]
[im 44/131  soft-tissue]
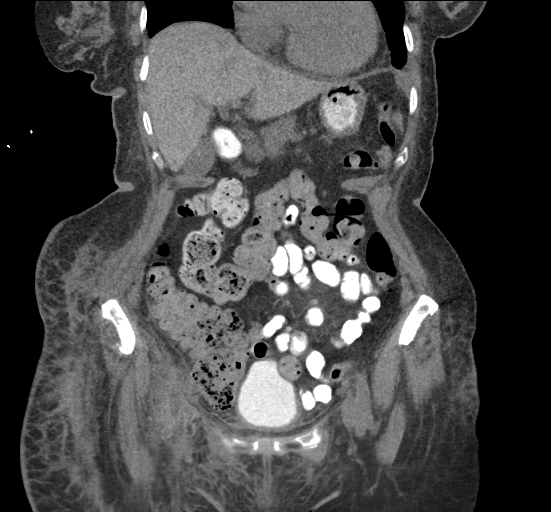
[im 58/131  soft-tissue]
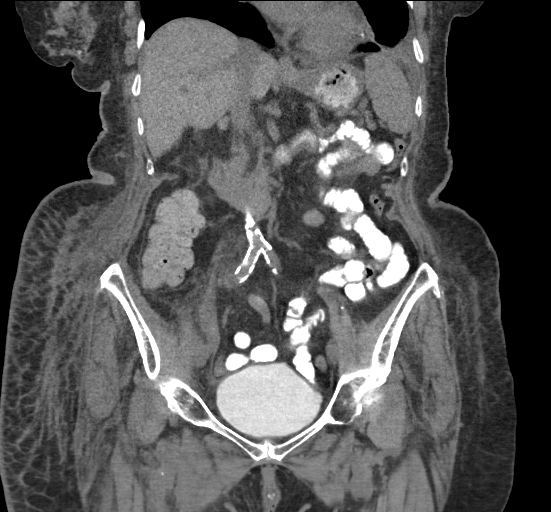
[im 73/131  soft-tissue]
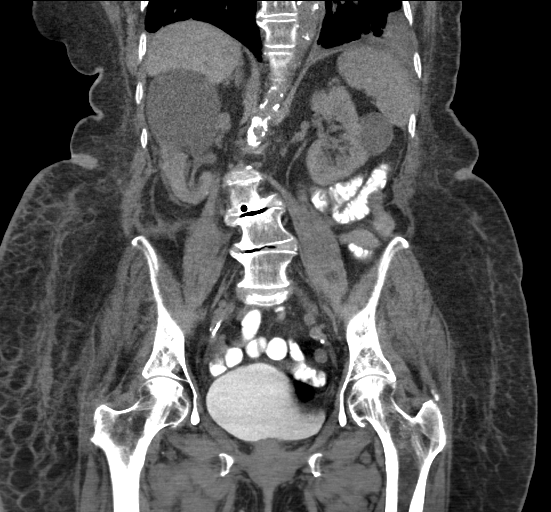

[16 of 46 positions shown; findings below may reference images not displayed]

FINDINGS: Lower chest: Small right pleural effusion and small to
moderate-sized left pleural effusion. Bilateral lower lobe
atelectasis, greater on the left. Mildly enlarged heart. Small
pericardial effusion with a maximum thickness of 5 mm.

Hepatobiliary: Multiple small gallstones in the dependent portion of
the gallbladder measuring up to 3 mm in maximum diameter each. No
gallbladder wall thickening or pericholecystic fluid. Unremarkable
liver.

Pancreas: Unremarkable. No pancreatic ductal dilatation or
surrounding inflammatory changes.

Spleen: Normal in size without focal abnormality.

Adrenals/Urinary Tract: Normal appearing adrenal glands. Multiple
bilateral renal cysts. Stable small left renal calculus. Normal
appearing urinary bladder filled with contrast. Unremarkable
ureters. No hydronephrosis.

Stomach/Bowel: Unremarkable stomach, small bowel and colon.
Surgically absent appendix.

Vascular/Lymphatic: Atheromatous arterial calcifications without
aneurysm. These include the coronary arteries. No enlarged lymph
nodes.

Reproductive: Status post hysterectomy. No adnexal masses.

Other: Bilateral subcutaneous edema, most pronounced in the right
pelvis and bilateral vulvar regions. Small amount presacral medium
density fluid measuring 31 Hounsfield units in density. Small amount
of fluid in the right posterior pararenal space measuring 6
Hounsfield units in density. No retroperitoneal or proximal thigh
hematoma seen.

Musculoskeletal: Moderate dextroconvex lumbar scoliosis. Marked
multilevel lumbar spine degenerative changes and mild degenerative
changes in the lower thoracic spine. No fractures, pars defects or
subluxations.
IMPRESSION: 1. No retroperitoneal or proximal thigh hematoma seen.
2. Small amount of medium density presacral fluid. This could
represent proteinaceous fluid or subacute hemorrhagic fluid.
3. Minimal simple fluid in the right posterior pararenal space.
4. Bilateral subcutaneous edema, most pronounced in the right pelvis
and bilateral vulvar regions.
5. Cholelithiasis.
6. Small right pleural effusion and small to moderate-sized left
pleural effusion.
7. Bilateral lower lobe atelectasis, greater on the left.
8. Small pericardial effusion.
9. Atheromatous calcifications, including the coronary arteries.
10. Stable small, nonobstructing left renal calculus.
